# Patient Record
Sex: Male | Born: 1945 | Race: White | Hispanic: No | Marital: Married | Smoking: Former smoker
Health system: Southern US, Community
[De-identification: ages and names within clinical notes are randomized; demographics above are authoritative.]

## PROBLEM LIST (undated history)

## (undated) DIAGNOSIS — D649 Anemia, unspecified: Secondary | ICD-10-CM

## (undated) DIAGNOSIS — Z87891 Personal history of nicotine dependence: Secondary | ICD-10-CM

## (undated) DIAGNOSIS — M1990 Unspecified osteoarthritis, unspecified site: Secondary | ICD-10-CM

## (undated) DIAGNOSIS — N400 Enlarged prostate without lower urinary tract symptoms: Secondary | ICD-10-CM

## (undated) DIAGNOSIS — R0600 Dyspnea, unspecified: Secondary | ICD-10-CM

## (undated) DIAGNOSIS — D46Z Other myelodysplastic syndromes: Secondary | ICD-10-CM

## (undated) DIAGNOSIS — G4730 Sleep apnea, unspecified: Secondary | ICD-10-CM

## (undated) DIAGNOSIS — K219 Gastro-esophageal reflux disease without esophagitis: Secondary | ICD-10-CM

## (undated) HISTORY — DX: Gastro-esophageal reflux disease without esophagitis: K21.9

## (undated) HISTORY — DX: Anemia, unspecified: D64.9

## (undated) HISTORY — PX: APPENDECTOMY: SHX54

## (undated) HISTORY — DX: Unspecified osteoarthritis, unspecified site: M19.90

## (undated) HISTORY — PX: HERNIA REPAIR: SHX51

## (undated) HISTORY — PX: SHOULDER ARTHROSCOPY: SHX128

---

## 2004-11-14 ENCOUNTER — Encounter: Admission: RE | Admit: 2004-11-14 | Discharge: 2004-11-14 | Payer: Self-pay | Admitting: Family Medicine

## 2004-11-22 ENCOUNTER — Encounter: Admission: RE | Admit: 2004-11-22 | Discharge: 2004-11-22 | Payer: Self-pay | Admitting: Family Medicine

## 2007-09-14 ENCOUNTER — Ambulatory Visit: Payer: Self-pay | Admitting: Internal Medicine

## 2007-09-14 DIAGNOSIS — R0989 Other specified symptoms and signs involving the circulatory and respiratory systems: Secondary | ICD-10-CM

## 2007-09-14 DIAGNOSIS — R0609 Other forms of dyspnea: Secondary | ICD-10-CM | POA: Insufficient documentation

## 2007-09-14 DIAGNOSIS — G4730 Sleep apnea, unspecified: Secondary | ICD-10-CM | POA: Insufficient documentation

## 2007-09-15 ENCOUNTER — Ambulatory Visit: Payer: Self-pay | Admitting: Cardiology

## 2008-10-31 ENCOUNTER — Encounter: Admission: RE | Admit: 2008-10-31 | Discharge: 2008-11-29 | Payer: Self-pay | Admitting: Family Medicine

## 2009-03-07 ENCOUNTER — Ambulatory Visit: Payer: Self-pay | Admitting: Internal Medicine

## 2009-03-19 LAB — COMPREHENSIVE METABOLIC PANEL
ALT: 19 U/L (ref 0–53)
Albumin: 4.7 g/dL (ref 3.5–5.2)
CO2: 26 mEq/L (ref 19–32)
Chloride: 105 mEq/L (ref 96–112)
Glucose, Bld: 77 mg/dL (ref 70–99)
Potassium: 4.5 mEq/L (ref 3.5–5.3)
Sodium: 140 mEq/L (ref 135–145)
Total Bilirubin: 1 mg/dL (ref 0.3–1.2)
Total Protein: 6.8 g/dL (ref 6.0–8.3)

## 2009-03-19 LAB — CBC WITH DIFFERENTIAL/PLATELET
Basophils Absolute: 0.1 10*3/uL (ref 0.0–0.1)
Eosinophils Absolute: 0.2 10*3/uL (ref 0.0–0.5)
HGB: 12.2 g/dL — ABNORMAL LOW (ref 13.0–17.1)
MCV: 91.5 fL (ref 79.3–98.0)
MONO#: 0.5 10*3/uL (ref 0.1–0.9)
MONO%: 8.1 % (ref 0.0–14.0)
NEUT#: 3.2 10*3/uL (ref 1.5–6.5)
RBC: 3.9 10*6/uL — ABNORMAL LOW (ref 4.20–5.82)
RDW: 20.1 % — ABNORMAL HIGH (ref 11.0–14.6)
WBC: 5.6 10*3/uL (ref 4.0–10.3)
lymph#: 1.7 10*3/uL (ref 0.9–3.3)
nRBC: 2 % — ABNORMAL HIGH (ref 0–0)

## 2009-03-19 LAB — IRON AND TIBC
%SAT: 53 % (ref 20–55)
Iron: 161 ug/dL (ref 42–165)

## 2009-03-19 LAB — LACTATE DEHYDROGENASE: LDH: 174 U/L (ref 94–250)

## 2009-03-21 LAB — PROTEIN ELECTROPHORESIS, SERUM
Albumin ELP: 66.9 % — ABNORMAL HIGH (ref 55.8–66.1)
Alpha-1-Globulin: 3.5 % (ref 2.9–4.9)
Beta Globulin: 6 % (ref 4.7–7.2)
Total Protein, Serum Electrophoresis: 7.1 g/dL (ref 6.0–8.3)

## 2009-03-21 LAB — FOLATE: Folate: >20 ng/mL

## 2009-04-03 LAB — CBC WITH DIFFERENTIAL/PLATELET
Eosinophils Absolute: 0.3 10*3/uL (ref 0.0–0.5)
HCT: 34.6 % — ABNORMAL LOW (ref 38.4–49.9)
LYMPH%: 28.4 % (ref 14.0–49.0)
MCV: 91.3 fL (ref 79.3–98.0)
MONO#: 0.5 10*3/uL (ref 0.1–0.9)
NEUT#: 3.1 10*3/uL (ref 1.5–6.5)
NEUT%: 55.6 % (ref 39.0–75.0)
Platelets: 165 10*3/uL (ref 140–400)
WBC: 5.5 10*3/uL (ref 4.0–10.3)

## 2009-06-29 ENCOUNTER — Ambulatory Visit: Payer: Self-pay | Admitting: Internal Medicine

## 2009-07-02 LAB — CBC WITH DIFFERENTIAL/PLATELET
Basophils Absolute: 0 10*3/uL (ref 0.0–0.1)
EOS%: 5 % (ref 0.0–7.0)
HCT: 33.5 % — ABNORMAL LOW (ref 38.4–49.9)
HGB: 11.5 g/dL — ABNORMAL LOW (ref 13.0–17.1)
LYMPH%: 25.4 % (ref 14.0–49.0)
MCH: 31.5 pg (ref 27.2–33.4)
MONO#: 0.5 10*3/uL (ref 0.1–0.9)
NEUT%: 61.8 % (ref 39.0–75.0)
Platelets: 167 10*3/uL (ref 140–400)
lymph#: 1.5 10*3/uL (ref 0.9–3.3)

## 2009-09-18 ENCOUNTER — Encounter: Admission: RE | Admit: 2009-09-18 | Discharge: 2009-09-18 | Payer: Self-pay | Admitting: *Deleted

## 2009-09-27 ENCOUNTER — Ambulatory Visit: Payer: Self-pay | Admitting: Internal Medicine

## 2009-10-01 LAB — CBC WITH DIFFERENTIAL/PLATELET
Basophils Absolute: 0.1 10*3/uL (ref 0.0–0.1)
Eosinophils Absolute: 0.2 10*3/uL (ref 0.0–0.5)
HCT: 35.5 % — ABNORMAL LOW (ref 38.4–49.9)
HGB: 12.3 g/dL — ABNORMAL LOW (ref 13.0–17.1)
LYMPH%: 29.7 % (ref 14.0–49.0)
MCV: 92 fL (ref 79.3–98.0)
MONO#: 0.4 10*3/uL (ref 0.1–0.9)
MONO%: 7.2 % (ref 0.0–14.0)
NEUT#: 3.2 10*3/uL (ref 1.5–6.5)
NEUT%: 58 % (ref 39.0–75.0)
Platelets: ADEQUATE 10*3/uL (ref 140–400)
RBC: 3.86 10*6/uL — ABNORMAL LOW (ref 4.20–5.82)
WBC: 5.5 10*3/uL (ref 4.0–10.3)
nRBC: 1 % — ABNORMAL HIGH (ref 0–0)

## 2009-12-21 ENCOUNTER — Ambulatory Visit: Payer: Self-pay | Admitting: Internal Medicine

## 2009-12-21 LAB — CBC WITH DIFFERENTIAL/PLATELET
BASO%: 0.6 % (ref 0.0–2.0)
Eosinophils Absolute: 0.2 10*3/uL (ref 0.0–0.5)
HCT: 35.9 % — ABNORMAL LOW (ref 38.4–49.9)
MCHC: 34.4 g/dL (ref 32.0–36.0)
MONO#: 0.5 10*3/uL (ref 0.1–0.9)
NEUT#: 4.5 10*3/uL (ref 1.5–6.5)
RBC: 3.72 10*6/uL — ABNORMAL LOW (ref 4.20–5.82)
WBC: 6.7 10*3/uL (ref 4.0–10.3)
lymph#: 1.5 10*3/uL (ref 0.9–3.3)

## 2009-12-21 LAB — IRON AND TIBC
%SAT: 40 % (ref 20–55)
Iron: 109 ug/dL (ref 42–165)
TIBC: 272 ug/dL (ref 215–435)

## 2010-03-24 ENCOUNTER — Encounter: Payer: Self-pay | Admitting: Family Medicine

## 2010-03-25 ENCOUNTER — Encounter: Payer: Self-pay | Admitting: Family Medicine

## 2010-05-28 ENCOUNTER — Other Ambulatory Visit: Payer: Self-pay | Admitting: Family Medicine

## 2010-05-28 ENCOUNTER — Ambulatory Visit
Admission: RE | Admit: 2010-05-28 | Discharge: 2010-05-28 | Disposition: A | Discharge status: Home / Self Care | Payer: PRIVATE HEALTH INSURANCE | Source: Ambulatory Visit | Attending: Family Medicine | Admitting: Family Medicine

## 2010-05-28 DIAGNOSIS — R05 Cough: Secondary | ICD-10-CM

## 2010-05-28 DIAGNOSIS — R059 Cough, unspecified: Secondary | ICD-10-CM

## 2010-05-28 DIAGNOSIS — R509 Fever, unspecified: Secondary | ICD-10-CM

## 2010-09-02 ENCOUNTER — Other Ambulatory Visit: Payer: Self-pay | Admitting: Family Medicine

## 2010-09-09 ENCOUNTER — Other Ambulatory Visit: Payer: PRIVATE HEALTH INSURANCE

## 2010-09-09 ENCOUNTER — Ambulatory Visit
Admission: RE | Admit: 2010-09-09 | Discharge: 2010-09-09 | Disposition: A | Discharge status: Home / Self Care | Payer: PRIVATE HEALTH INSURANCE | Source: Ambulatory Visit | Attending: Family Medicine | Admitting: Family Medicine

## 2010-09-09 MED ORDER — IOHEXOL 300 MG/ML  SOLN
100.0000 mL | Freq: Once | INTRAMUSCULAR | Status: AC | PRN
  Administered 2010-09-09: 100 mL via INTRAVENOUS

## 2011-05-08 DIAGNOSIS — K219 Gastro-esophageal reflux disease without esophagitis: Secondary | ICD-10-CM | POA: Insufficient documentation

## 2011-09-19 DIAGNOSIS — G2581 Restless legs syndrome: Secondary | ICD-10-CM | POA: Insufficient documentation

## 2012-06-02 ENCOUNTER — Telehealth: Payer: Self-pay | Admitting: Internal Medicine

## 2012-06-02 NOTE — Telephone Encounter (Signed)
Mail out to [facility]

## 2012-06-29 ENCOUNTER — Other Ambulatory Visit: Payer: Self-pay | Admitting: Medical Oncology

## 2012-06-29 ENCOUNTER — Telehealth: Payer: Self-pay | Admitting: Medical Oncology

## 2012-06-29 NOTE — Telephone Encounter (Signed)
I left another message for Shawn Bentley to call about appt with Dr Arbutus Ped.

## 2012-06-30 ENCOUNTER — Telehealth: Payer: Self-pay | Admitting: Medical Oncology

## 2012-06-30 ENCOUNTER — Other Ambulatory Visit: Payer: Self-pay | Admitting: Medical Oncology

## 2012-06-30 DIAGNOSIS — D649 Anemia, unspecified: Secondary | ICD-10-CM

## 2012-06-30 NOTE — Telephone Encounter (Signed)
Dr Tenna Delaine office called and pt does need to be seen again .Onc Tx sent.

## 2012-07-22 ENCOUNTER — Telehealth: Payer: Self-pay | Admitting: Internal Medicine

## 2012-07-22 NOTE — Telephone Encounter (Signed)
S/w pt today re appt for 5/27 @ 11am. Pt last seen 2011. pof entered 4/30 for 3wk appt. pof did not cross over to scheduling inbox. Alerted by T[place] in HIM that pt was to have an appt this week.

## 2012-07-27 ENCOUNTER — Ambulatory Visit (HOSPITAL_BASED_OUTPATIENT_CLINIC_OR_DEPARTMENT_OTHER): Payer: PRIVATE HEALTH INSURANCE | Admitting: Internal Medicine

## 2012-07-27 ENCOUNTER — Encounter: Payer: Self-pay | Admitting: Internal Medicine

## 2012-07-27 ENCOUNTER — Telehealth: Payer: Self-pay | Admitting: Internal Medicine

## 2012-07-27 ENCOUNTER — Other Ambulatory Visit (HOSPITAL_BASED_OUTPATIENT_CLINIC_OR_DEPARTMENT_OTHER): Payer: PRIVATE HEALTH INSURANCE | Admitting: Lab

## 2012-07-27 VITALS — BP 116/65 | HR 63 | Temp 97.1°F | Resp 18 | Ht 67.5 in | Wt 183.5 lb

## 2012-07-27 DIAGNOSIS — D509 Iron deficiency anemia, unspecified: Secondary | ICD-10-CM

## 2012-07-27 DIAGNOSIS — D649 Anemia, unspecified: Secondary | ICD-10-CM

## 2012-07-27 LAB — COMPREHENSIVE METABOLIC PANEL (CC13)
ALT: 26 U/L (ref 0–55)
AST: 21 U/L (ref 5–34)
Albumin: 4 g/dL (ref 3.5–5.0)
CO2: 25 mEq/L (ref 22–29)
Calcium: 9.2 mg/dL (ref 8.4–10.4)
Chloride: 108 mEq/L — ABNORMAL HIGH (ref 98–107)
Potassium: 4.4 mEq/L (ref 3.5–5.1)

## 2012-07-27 LAB — CBC WITH DIFFERENTIAL/PLATELET
Basophils Absolute: 0.1 10*3/uL (ref 0.0–0.1)
Eosinophils Absolute: 0.2 10*3/uL (ref 0.0–0.5)
HGB: 10.8 g/dL — ABNORMAL LOW (ref 13.0–17.1)
LYMPH%: 28.6 % (ref 14.0–49.0)
MCV: 90.1 fL (ref 79.3–98.0)
MONO%: 8.9 % (ref 0.0–14.0)
NEUT#: 3.2 10*3/uL (ref 1.5–6.5)
NEUT%: 58.2 % (ref 39.0–75.0)
Platelets: 148 10*3/uL (ref 140–400)

## 2012-07-27 LAB — TECHNOLOGIST REVIEW

## 2012-07-27 LAB — IRON AND TIBC: %SAT: 93 % — ABNORMAL HIGH (ref 20–55)

## 2012-07-27 LAB — FERRITIN: Ferritin: 1074 ng/mL — ABNORMAL HIGH (ref 22–322)

## 2012-07-27 MED ORDER — INTEGRA PLUS PO CAPS: 1.0000 | ORAL_CAPSULE | Freq: Every morning | ORAL | Status: DC

## 2012-07-27 NOTE — Patient Instructions (Signed)
Resume Integra plus Followup visit in 2 months with repeat CBC and iron study.

## 2012-07-27 NOTE — Telephone Encounter (Signed)
gv pt appt schedule for July.  

## 2012-07-27 NOTE — Progress Notes (Signed)
[facility] Telephone:([phone]   Fax:(336) [phone]  OFFICE PROGRESS NOTE  [name], MD [phone] [address] [place] [address]  DIAGNOSIS: Iron deficiency anemia  PRIOR THERAPY: None.  CURRENT THERAPY: Integra plus 1 capsule by mouth daily.  INTERVAL HISTORY: Shawn Bentley 67 y.o. male returns to the clinic today for followup visit. The patient was last seen on 01/01/2010. He has been on treatment with Integra plus her iron deficiency anemia. Integra plus was discontinued and the patient used over the counter iron supplements for the last few years. He started feeling more tired and fatigued recently. He was seen by his primary care physician and was found to have anemia. He was referred back to me for evaluation. He denied having any other significant complaints. He has no chest pain, shortness of breath, cough or hemoptysis. He has no dizzy spells.   MEDICAL HISTORY: Past Medical History  Diagnosis Date  . Anemia   . Arthritis   . GERD (gastroesophageal reflux disease)     ALLERGIES:  is allergic to sinus.  MEDICATIONS:  Current Outpatient Prescriptions  Medication Sig Dispense Refill  . aspirin 81 MG tablet Take 81 mg by mouth daily.      . diphenhydrAMINE (BENADRYL) 25 mg capsule Take 25 mg by mouth 2 (two) times daily.      . ferrous sulfate 325 (65 FE) MG tablet Take 325 mg by mouth daily with breakfast.      . Multiple Vitamin (MULTIVITAMIN) tablet Take 1 tablet by mouth daily.      . saw palmetto 160 MG capsule Take 160 mg by mouth 2 (two) times daily.      . vitamin C (ASCORBIC ACID) 500 MG tablet Take 500 mg by mouth daily.      . vitamin E 400 UNIT capsule Take 400 Units by mouth daily.      Marland Kitchen omeprazole (PRILOSEC) 20 MG capsule Take 40 mg by mouth daily.      Marland Kitchen rOPINIRole (REQUIP) 0.5 MG tablet Take 0.5 mg by mouth as needed.       No current facility-administered medications for this visit.    SURGICAL HISTORY:    Past Surgical History  Procedure Laterality Date  . Appendectomy      REVIEW OF SYSTEMS:  A comprehensive review of systems was negative except for: Constitutional: positive for fatigue   PHYSICAL EXAMINATION: General appearance: alert, cooperative, fatigued and no distress Head: Normocephalic, without obvious abnormality, atraumatic Neck: no adenopathy Lymph nodes: Cervical, supraclavicular, and axillary nodes normal. Resp: clear to auscultation bilaterally Cardio: regular rate and rhythm, S1, S2 normal, no murmur, click, rub or gallop GI: soft, non-tender; bowel sounds normal; no masses,  no organomegaly Extremities: extremities normal, atraumatic, no cyanosis or edema  ECOG PERFORMANCE STATUS: 1 - Symptomatic but completely ambulatory  Blood pressure 116/65, pulse 63, temperature 97.1 F (36.2 C), temperature source Oral, resp. rate 18, height 5' 7.5" (1.715 m), weight 183 lb 8 oz (83.235 kg).  LABORATORY DATA: Lab Results  Component Value Date   WBC 5.5 07/27/2012   HGB 10.8* 07/27/2012   HCT 31.8* 07/27/2012   MCV 90.1 07/27/2012   PLT 148 07/27/2012      Chemistry      Component Value Date/Time   NA 141 07/27/2012 1035   NA 140 03/19/2009 0932   K 4.4 07/27/2012 1035   K 4.5 03/19/2009 0932   CL 108* 07/27/2012 1035  CL 105 03/19/2009 0932   CO2 25 07/27/2012 1035   CO2 26 03/19/2009 0932   BUN 15.7 07/27/2012 1035   BUN 17 03/19/2009 0932   CREATININE 0.9 07/27/2012 1035   CREATININE 1.28 03/19/2009 0932      Component Value Date/Time   CALCIUM 9.2 07/27/2012 1035   CALCIUM 9.5 03/19/2009 0932   ALKPHOS 62 07/27/2012 1035   ALKPHOS 57 03/19/2009 0932   AST 21 07/27/2012 1035   AST 16 03/19/2009 0932   ALT 26 07/27/2012 1035   ALT 19 03/19/2009 0932   BILITOT 1.32* 07/27/2012 1035   BILITOT 1.0 03/19/2009 0932       RADIOGRAPHIC STUDIES: No results found.  ASSESSMENT: This is a very pleasant 67 years old white male with history of iron deficiency anemia.   PLAN: I  ordered repeat CBC and iron study today. I will start the patient on Integra plus 1 capsule by mouth daily. I would see him back for followup visit in 2 months with repeat CBC, iron study and ferritin. All questions were answered. The patient knows to call the clinic with any problems, questions or concerns. We can certainly see the patient much sooner if necessary.

## 2012-09-07 ENCOUNTER — Telehealth: Payer: Self-pay | Admitting: Internal Medicine

## 2012-09-07 NOTE — Telephone Encounter (Signed)
s.w. pt and advised on 7.28.14 appt being move Dr. Kerry Fort on pal

## 2012-09-22 ENCOUNTER — Other Ambulatory Visit (HOSPITAL_BASED_OUTPATIENT_CLINIC_OR_DEPARTMENT_OTHER): Payer: Medicare Other

## 2012-09-22 DIAGNOSIS — D509 Iron deficiency anemia, unspecified: Secondary | ICD-10-CM

## 2012-09-22 LAB — CBC WITH DIFFERENTIAL/PLATELET
EOS%: 3.6 % (ref 0.0–7.0)
LYMPH%: 29 % (ref 14.0–49.0)
MCH: 31 pg (ref 27.2–33.4)
MCHC: 33.5 g/dL (ref 32.0–36.0)
MCV: 92.4 fL (ref 79.3–98.0)
MONO%: 10.5 % (ref 0.0–14.0)
RBC: 3.42 10*6/uL — ABNORMAL LOW (ref 4.20–5.82)
RDW: 25.3 % — ABNORMAL HIGH (ref 11.0–14.6)
nRBC: 2 % — ABNORMAL HIGH (ref 0–0)

## 2012-09-22 LAB — FERRITIN CHCC: Ferritin: 1131 ng/ml — ABNORMAL HIGH (ref 22–316)

## 2012-09-27 ENCOUNTER — Ambulatory Visit: Payer: PRIVATE HEALTH INSURANCE | Admitting: Internal Medicine

## 2012-09-28 ENCOUNTER — Ambulatory Visit (HOSPITAL_BASED_OUTPATIENT_CLINIC_OR_DEPARTMENT_OTHER): Payer: Medicare Other | Admitting: Internal Medicine

## 2012-09-28 ENCOUNTER — Telehealth: Payer: Self-pay | Admitting: Internal Medicine

## 2012-09-28 ENCOUNTER — Ambulatory Visit (HOSPITAL_BASED_OUTPATIENT_CLINIC_OR_DEPARTMENT_OTHER): Payer: Medicare Other | Admitting: Lab

## 2012-09-28 ENCOUNTER — Encounter: Payer: Self-pay | Admitting: Internal Medicine

## 2012-09-28 NOTE — Patient Instructions (Signed)
Discontinued treatment with Integra plus. Follow up visit in one month

## 2012-09-28 NOTE — Telephone Encounter (Signed)
pt returned to lab today and see MD end of August 2014 1 month from now

## 2012-09-28 NOTE — Progress Notes (Signed)
[facility] Telephone:(336) [phone]   Fax:([phone]  OFFICE PROGRESS NOTE  [name], MD [phone] [address], [address] [place] [address]  DIAGNOSIS: History of Iron deficiency anemia.   PRIOR THERAPY:  Integra plus 1 capsule by mouth daily..   CURRENT THERAPY: None.   INTERVAL HISTORY: Shawn Bentley 67 y.o. male returns to the clinic today for routine follow up visit. The patient is feeling fine today with no specific complaints except for mild fatigue. He is currently on treatment with Integra plus 1 capsule by mouth daily and tolerating it fairly well. He had repeat CBC, iron study and ferritin performed recently and he is here for evaluation and discussion of his lab results.his ferritin on 09/22/2012 was so elevated at 1131. Iron level was also elevated at 165 with iron saturation of 73%. His hemoglobin is still low at 10.6 with hematocrit of 31.6%.  MEDICAL HISTORY: Past Medical History  Diagnosis Date  . Anemia   . Arthritis   . GERD (gastroesophageal reflux disease)     ALLERGIES:  is allergic to sinus.  MEDICATIONS:  Current Outpatient Prescriptions  Medication Sig Dispense Refill  . aspirin 81 MG tablet Take 81 mg by mouth daily.      . diphenhydrAMINE (BENADRYL) 25 mg capsule Take 25 mg by mouth 2 (two) times daily.      Marland Kitchen FeFum-FePoly-FA-B Cmp-C-Biot (INTEGRA PLUS) CAPS Take 1 capsule by mouth every morning.  30 capsule  3  . Multiple Vitamin (MULTIVITAMIN) tablet Take 1 tablet by mouth daily.      Marland Kitchen omeprazole (PRILOSEC) 20 MG capsule Take 40 mg by mouth daily.      Marland Kitchen rOPINIRole (REQUIP) 0.5 MG tablet Take 0.5 mg by mouth as needed.      . saw palmetto 160 MG capsule Take 160 mg by mouth 2 (two) times daily.      . vitamin C (ASCORBIC ACID) 500 MG tablet Take 500 mg by mouth daily.      . vitamin E 400 UNIT capsule Take 400 Units by mouth daily.       No current facility-administered medications for this visit.    SURGICAL  HISTORY:  Past Surgical History  Procedure Laterality Date  . Appendectomy      REVIEW OF SYSTEMS:  A comprehensive review of systems was negative except for: Constitutional: positive for fatigue   PHYSICAL EXAMINATION: General appearance: alert, cooperative and fatigued Head: Normocephalic, without obvious abnormality, atraumatic Neck: no adenopathy Lymph nodes: Cervical, supraclavicular, and axillary nodes normal. Resp: clear to auscultation bilaterally Cardio: regular rate and rhythm, S1, S2 normal, no murmur, click, rub or gallop GI: soft, non-tender; bowel sounds normal; no masses,  no organomegaly Extremities: extremities normal, atraumatic, no cyanosis or edema  ECOG PERFORMANCE STATUS: 0 - Asymptomatic  Blood pressure 123/72, pulse 64, temperature 97.4 F (36.3 C), temperature source Oral, resp. rate 20, height 5' 7.5" (1.715 m), weight 187 lb 1.6 oz (84.868 kg).  LABORATORY DATA: Lab Results  Component Value Date   WBC 5.3 09/22/2012   HGB 10.6* 09/22/2012   HCT 31.6* 09/22/2012   MCV 92.4 09/22/2012   PLT 144 09/22/2012      Chemistry      Component Value Date/Time   NA 141 07/27/2012 1035   NA 140 03/19/2009 0932   K 4.4 07/27/2012 1035   K 4.5 03/19/2009 0932   CL 108* 07/27/2012 1035   CL 105 03/19/2009 0932  CO2 25 07/27/2012 1035   CO2 26 03/19/2009 0932   BUN 15.7 07/27/2012 1035   BUN 17 03/19/2009 0932   CREATININE 0.9 07/27/2012 1035   CREATININE 1.28 03/19/2009 0932      Component Value Date/Time   CALCIUM 9.2 07/27/2012 1035   CALCIUM 9.5 03/19/2009 0932   ALKPHOS 62 07/27/2012 1035   ALKPHOS 57 03/19/2009 0932   AST 21 07/27/2012 1035   AST 16 03/19/2009 0932   ALT 26 07/27/2012 1035   ALT 19 03/19/2009 0932   BILITOT 1.32* 07/27/2012 1035   BILITOT 1.0 03/19/2009 0932       RADIOGRAPHIC STUDIES: No results found.  ASSESSMENT AND PLAN: this is a very pleasant 67 years old white male with a persistent anemia thought initially to be secondary to iron  deficiency and the patient was treated with Integra plus. His grandmother are still elevated at this point. I recommended for the patient to discontinue his treatment with Integra plus. I will check the patient for hemochromatosis DNA to rule out any iron metabolism abnormality. He would come back for follow up visit in one month's for reevaluation. He was advised to call immediately if he has any concerning symptoms in the interval.  The patient voices understanding of current disease status and treatment options and is in agreement with the current care plan.  All questions were answered. The patient knows to call the clinic with any problems, questions or concerns. We can certainly see the patient much sooner if necessary.

## 2012-09-30 LAB — HEAVY METALS, BLOOD
Arsenic: <3 mcg/L (ref <23)
Lead: <2 ug/dL (ref <10)
Mercury, B: <4 mcg/L (ref <=10)

## 2012-09-30 LAB — HEMOCHROMATOSIS DNA-PCR(C282Y,H63D)

## 2012-10-27 ENCOUNTER — Other Ambulatory Visit: Payer: Self-pay | Admitting: *Deleted

## 2012-10-28 ENCOUNTER — Ambulatory Visit: Payer: Medicare Other | Admitting: Internal Medicine

## 2012-10-28 ENCOUNTER — Telehealth: Payer: Self-pay | Admitting: Internal Medicine

## 2012-10-28 NOTE — Telephone Encounter (Signed)
sw. pt and advised on 8.3.14 est visit....pt ok and aware

## 2012-11-03 ENCOUNTER — Encounter: Payer: Self-pay | Admitting: Physician Assistant

## 2012-11-03 ENCOUNTER — Ambulatory Visit (HOSPITAL_BASED_OUTPATIENT_CLINIC_OR_DEPARTMENT_OTHER): Payer: Medicare Other | Admitting: Lab

## 2012-11-03 ENCOUNTER — Telehealth: Payer: Self-pay | Admitting: Internal Medicine

## 2012-11-03 ENCOUNTER — Ambulatory Visit (HOSPITAL_BASED_OUTPATIENT_CLINIC_OR_DEPARTMENT_OTHER): Payer: Medicare Other | Admitting: Physician Assistant

## 2012-11-03 VITALS — BP 119/77 | HR 63 | Temp 97.7°F | Resp 18 | Ht 67.0 in | Wt 183.7 lb

## 2012-11-03 DIAGNOSIS — D649 Anemia, unspecified: Secondary | ICD-10-CM | POA: Insufficient documentation

## 2012-11-03 DIAGNOSIS — R300 Dysuria: Secondary | ICD-10-CM

## 2012-11-03 LAB — CBC WITH DIFFERENTIAL/PLATELET
BASO%: 0.9 % (ref 0.0–2.0)
Basophils Absolute: 0.1 10*3/uL (ref 0.0–0.1)
EOS%: 4.2 % (ref 0.0–7.0)
HCT: 32.5 % — ABNORMAL LOW (ref 38.4–49.9)
HGB: 11 g/dL — ABNORMAL LOW (ref 13.0–17.1)
MCH: 31.1 pg (ref 27.2–33.4)
MCHC: 33.8 g/dL (ref 32.0–36.0)
MCV: 91.8 fL (ref 79.3–98.0)
MONO%: 11.6 % (ref 0.0–14.0)
NEUT%: 58.8 % (ref 39.0–75.0)

## 2012-11-03 LAB — URINALYSIS, MICROSCOPIC - CHCC
Leukocyte Esterase: NEGATIVE
Protein: <30 mg/dL
Urobilinogen, UR: 0.2 mg/dL (ref 0.2–1)
pH: 6.5 (ref 4.6–8.0)

## 2012-11-03 NOTE — Patient Instructions (Addendum)
You are being set up for a bone marrow biopsy to further evaluate your anemia You will followup with Dr. Arbutus Ped in approximately 3 weeks to discuss the results of this study

## 2012-11-03 NOTE — Progress Notes (Addendum)
[facility] Telephone:(336) [phone]   [phone]  SHARED VISIT PROGRESS NOTE  Shawn Copas, MD [phone] N. [address], [address] [place] [address]  DIAGNOSIS: History of Iron deficiency anemia.   PRIOR THERAPY:  Integra plus 1 capsule by mouth daily..   CURRENT THERAPY: None.   INTERVAL HISTORY: Shawn Bentley 67 y.o. male returns to the clinic today for routine follow up visit, accompanied by his wife. He complains of increased fatigue. He has been coming home from work at lunch and sleeping for about 4 hours.  He complains of occasional burning with urination over the past couple months. He has stopped that Dr. Asa Lente instructions his iron supplement as well as all of his vitamin supplements. He had further laboratory studies done after his last office visit in late July was and is here to discuss those test results.  He had blood drawn for hemochromatosis  and he only had 1 gene for a heterozygous hemochromatosis present. His  studies for heavy metals were negative. He had an extensive workup in January 2011 which revealed a normal erythropoietin level, normal B12 level, and normal folate level. A serum protein electrophoresis  revealed no M spike the albumin ELP was 66.9 and considered high and the alpha 2 globulin was 6.5 been considered low. His ferritin on 09/22/2012 was very elevated at 1131. Iron level was also elevated at 165 with iron saturation of 73%. His hemoglobin is still low at 10.6 with hematocrit of 31.6%. Today his hemoglobin was 11.0 with hematocrit of 32.5%.  MEDICAL HISTORY: Past Medical History  Diagnosis Date  . Anemia   . Arthritis   . GERD (gastroesophageal reflux disease)     ALLERGIES:  is allergic to sinus.  MEDICATIONS:  Current Outpatient Prescriptions  Medication Sig Dispense Refill  . aspirin 81 MG tablet Take 81 mg by mouth daily.      . diphenhydrAMINE (BENADRYL) 25 mg capsule Take 25 mg by mouth 2 (two) times  daily.      Marland Kitchen FeFum-FePoly-FA-B Cmp-C-Biot (INTEGRA PLUS) CAPS Take 1 capsule by mouth every morning.  30 capsule  3  . Multiple Vitamin (MULTIVITAMIN) tablet Take 1 tablet by mouth daily.      Marland Kitchen omeprazole (PRILOSEC) 20 MG capsule Take 40 mg by mouth daily.      Marland Kitchen rOPINIRole (REQUIP) 0.5 MG tablet Take 0.5 mg by mouth as needed.      . saw palmetto 160 MG capsule Take 160 mg by mouth 2 (two) times daily.      . vitamin C (ASCORBIC ACID) 500 MG tablet Take 500 mg by mouth daily.      . vitamin E 400 UNIT capsule Take 400 Units by mouth daily.       No current facility-administered medications for this visit.    SURGICAL HISTORY:  Past Surgical History  Procedure Laterality Date  . Appendectomy      REVIEW OF SYSTEMS:  A comprehensive review of systems was negative except for: Constitutional: positive for fatigue Genitourinary: positive for dysuria   PHYSICAL EXAMINATION: General appearance: alert, cooperative and fatigued Head: Normocephalic, without obvious abnormality, atraumatic Neck: no adenopathy Lymph nodes: Cervical, supraclavicular, and axillary nodes normal. Resp: clear to auscultation bilaterally Cardio: regular rate and rhythm, S1, S2 normal, no murmur, click, rub or gallop GI: soft, non-tender; bowel sounds normal; no masses,  no organomegaly Extremities: extremities normal, atraumatic, no cyanosis or edema  ECOG PERFORMANCE STATUS: 0 - Asymptomatic  Blood pressure 119/77, pulse 63, temperature 97.7 F (36.5 C), temperature source Oral, resp. rate 18, height 5\' 7"  (1.702 m), weight 183 lb 11.2 oz (83.326 kg).  LABORATORY DATA: Lab Results  Component Value Date   WBC 6.4 11/03/2012   HGB 11.0* 11/03/2012   HCT 32.5* 11/03/2012   MCV 91.8 11/03/2012   PLT 195 11/03/2012      Chemistry      Component Value Date/Time   NA 141 07/27/2012 1035   NA 140 03/19/2009 0932   K 4.4 07/27/2012 1035   K 4.5 03/19/2009 0932   CL 108* 07/27/2012 1035   CL 105 03/19/2009 0932   CO2  25 07/27/2012 1035   CO2 26 03/19/2009 0932   BUN 15.7 07/27/2012 1035   BUN 17 03/19/2009 0932   CREATININE 0.9 07/27/2012 1035   CREATININE 1.28 03/19/2009 0932      Component Value Date/Time   CALCIUM 9.2 07/27/2012 1035   CALCIUM 9.5 03/19/2009 0932   ALKPHOS 62 07/27/2012 1035   ALKPHOS 57 03/19/2009 0932   AST 21 07/27/2012 1035   AST 16 03/19/2009 0932   ALT 26 07/27/2012 1035   ALT 19 03/19/2009 0932   BILITOT 1.32* 07/27/2012 1035   BILITOT 1.0 03/19/2009 0932       RADIOGRAPHIC STUDIES: No results found.  ASSESSMENT AND PLAN: this is a very pleasant 67 years old white male with a persistent anemia thought initially to be secondary to iron deficiency and the patient was treated with Integra plus. Patient was discussed with also seen by Dr. Arbutus Ped. He has anemia without a clear etiology particularly in the setting of slightly low hemoglobin and hematocrit with high ferritin and elevated serum iron and iron saturation. We will plan to refer the patient to interventional radiology for bone marrow biopsy and aspirate sent off for morphology cytology and cytogenetics. This will be done in the next week with the patient returning approximately 2 weeks after that to discuss the results of that study. He will remain off of all iron supplementation in the interim. To further assess the patient's complaints of intermittent dysuria we'll obtain a clean catch urinalysis today. If indicated culture and sensitivity will be sent and an infection is present appropriate antibiotic therapy will be instituted.   Laural Benes, Annalyce Lanpher E, PA-C   He was advised to call immediately if he has any concerning symptoms in the interval.  The patient voices understanding of current disease status and treatment options and is in agreement with the current care plan.  All questions were answered. The patient knows to call the clinic with any problems, questions or concerns. We can certainly see the patient much sooner if  necessary.   ADDENDUM:  Hematology/Oncology Attending:  I have face to face encounter with the patient. I recommended his care plan. The patient has persistent anemia in the absence of iron deficiency or any other abnormalities. 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 The heavy metal studies were unremarkable and transferrin receptor was elevated at 2.66. I recommended for the patient to proceed with the bone marrow biopsy and aspirate for evaluation of his persistent anemia. I would see him back for followup visit in 2 weeks for evaluation and discussion of his biopsy results. He was advised to call immediately if he has any concerning symptoms in the interval. Lajuana Matte., MD 11/06/2012

## 2012-11-03 NOTE — Telephone Encounter (Signed)
gv andprinted appt sched and avs for pt for SEpt...sent pt back to the lab °

## 2012-11-04 ENCOUNTER — Other Ambulatory Visit: Payer: Self-pay | Admitting: Radiology

## 2012-11-08 ENCOUNTER — Ambulatory Visit (HOSPITAL_COMMUNITY)
Admission: RE | Admit: 2012-11-08 | Discharge: 2012-11-08 | Disposition: A | Discharge status: Home / Self Care | Payer: Medicare Other | Source: Ambulatory Visit | Attending: Internal Medicine | Admitting: Internal Medicine

## 2012-11-08 ENCOUNTER — Encounter (HOSPITAL_COMMUNITY): Payer: Self-pay

## 2012-11-08 DIAGNOSIS — D649 Anemia, unspecified: Secondary | ICD-10-CM | POA: Insufficient documentation

## 2012-11-08 DIAGNOSIS — D696 Thrombocytopenia, unspecified: Secondary | ICD-10-CM | POA: Insufficient documentation

## 2012-11-08 LAB — CBC
HCT: 31.9 % — ABNORMAL LOW (ref 39.0–52.0)
Hemoglobin: 11.2 g/dL — ABNORMAL LOW (ref 13.0–17.0)
RBC: 3.5 MIL/uL — ABNORMAL LOW (ref 4.22–5.81)
WBC: 4.7 10*3/uL (ref 4.0–10.5)

## 2012-11-08 LAB — BONE MARROW EXAM

## 2012-11-08 LAB — APTT: aPTT: 29 seconds (ref 24–37)

## 2012-11-08 LAB — PROTIME-INR: INR: 1.09 (ref 0.00–1.49)

## 2012-11-08 MED ORDER — MIDAZOLAM HCL 2 MG/2ML IJ SOLN
INTRAMUSCULAR | Status: AC
  Filled 2012-11-08: qty 6

## 2012-11-08 MED ORDER — FENTANYL CITRATE 0.05 MG/ML IJ SOLN
INTRAMUSCULAR | Status: DC | PRN
  Administered 2012-11-08: 100 ug via INTRAVENOUS

## 2012-11-08 MED ORDER — MIDAZOLAM HCL 2 MG/2ML IJ SOLN
INTRAMUSCULAR | Status: DC | PRN
  Administered 2012-11-08 (×2): 1 mg via INTRAVENOUS

## 2012-11-08 MED ORDER — FENTANYL CITRATE 0.05 MG/ML IJ SOLN
INTRAMUSCULAR | Status: AC
  Filled 2012-11-08: qty 6

## 2012-11-08 MED ORDER — SODIUM CHLORIDE 0.9 % IV SOLN
INTRAVENOUS | Status: DC
  Administered 2012-11-08: 09:00:00 via INTRAVENOUS

## 2012-11-08 NOTE — H&P (Signed)
Agree 

## 2012-11-08 NOTE — Procedures (Signed)
Procedure:  CT guided bone marrow biopsy Findings:  Aspirate and core biopsy of right iliac bone marrow. No complications. 

## 2012-11-08 NOTE — H&P (Signed)
Shawn Bentley is an 67 y.o. male.   Chief Complaint: "I'm having a bone biopsy" HPI: Patient with history of anemia of unknown etiology presents today for CT guided bone marrow biopsy.  Past Medical History  Diagnosis Date  . Anemia   . Arthritis   . GERD (gastroesophageal reflux disease)     Past Surgical History  Procedure Laterality Date  . Appendectomy      History reviewed. No pertinent family history. Social History:  reports that he quit smoking about 40 years ago. His smoking use included Cigarettes. He has a 6 pack-year smoking history. He quit smokeless tobacco use about 40 years ago. He reports that  drinks alcohol. His drug history is not on file.  Allergies:  Allergies  Allergen Reactions  . Sinus [Pseudoephedrine-Acetaminophen] Other (See Comments)    jittery    Current outpatient prescriptions:diphenhydrAMINE (BENADRYL) 25 mg capsule, Take 25 mg by mouth 2 (two) times daily., Disp: , Rfl: ;  FeFum-FePoly-FA-B Cmp-C-Biot (INTEGRA PLUS) CAPS, Take 1 capsule by mouth every morning., Disp: 30 capsule, Rfl: 3;  omeprazole (PRILOSEC) 20 MG capsule, Take 20 mg by mouth daily. , Disp: , Rfl: ;  rOPINIRole (REQUIP) 0.5 MG tablet, Take 0.5 mg by mouth at bedtime as needed (for restless leg syndrome). , Disp: , Rfl:  saw palmetto 160 MG capsule, Take 160 mg by mouth 2 (two) times daily., Disp: , Rfl:  Current facility-administered medications:0.9 %  sodium chloride infusion, , Intravenous, Continuous, D Jeananne Rama, PA-C, Last Rate: 20 mL/hr at 11/08/12 6440   Results for orders placed during the hospital encounter of 11/08/12 (from the past 48 hour(s))  APTT     Status: None   Collection Time    11/08/12  9:20 AM      Result Value Range   aPTT 29  24 - 37 seconds  CBC     Status: Abnormal   Collection Time    11/08/12  9:20 AM      Result Value Range   WBC 4.7  4.0 - 10.5 K/uL   RBC 3.50 (*) 4.22 - 5.81 MIL/uL   Hemoglobin 11.2 (*) 13.0 - 17.0 g/dL   HCT 34.7 (*)  42.5 - 52.0 %   MCV 91.1  78.0 - 100.0 fL   MCH 32.0  26.0 - 34.0 pg   MCHC 35.1  30.0 - 36.0 g/dL   RDW 95.6 (*) 38.7 - 56.4 %   Platelets 133 (*) 150 - 400 K/uL   Comment: SPECIMEN CHECKED FOR CLOTS     REPEATED TO VERIFY  PROTIME-INR     Status: None   Collection Time    11/08/12  9:20 AM      Result Value Range   Prothrombin Time 13.9  11.6 - 15.2 seconds   INR 1.09  0.00 - 1.49   No results found.  Review of Systems  Constitutional: Negative for fever and chills.  Respiratory: Positive for shortness of breath. Negative for hemoptysis.        Occ cough  Cardiovascular: Negative for chest pain.  Gastrointestinal: Negative for nausea, vomiting, abdominal pain and blood in stool.  Genitourinary: Negative for hematuria.       Occ dysuria  Musculoskeletal: Positive for back pain.  Neurological: Negative for headaches.  Endo/Heme/Allergies: Does not bruise/bleed easily.    Blood pressure 111/57, pulse 53, temperature 97.8 F (36.6 C), temperature source Oral, resp. rate 18, height 5\' 7"  (1.702 m), weight 180 lb (81.647 kg), SpO2  99.00%. Physical Exam  Constitutional: He is oriented to person, place, and time. He appears well-developed and well-nourished.  Cardiovascular: Regular rhythm.   Sl brady but reg rhythm  GI: Soft. Bowel sounds are normal. There is no tenderness.  Musculoskeletal: Normal range of motion. He exhibits no edema.  Neurological: He is alert and oriented to person, place, and time.     Assessment/Plan Pt with hx of anemia of unknown etiology . Plan is for CT guided bone marrow biopsy today. Details/risks of procedure d/w pt/wife with their understanding and consent.  ALLRED,D KEVIN 11/08/2012, 9:55 AM

## 2012-11-16 LAB — CHROMOSOME ANALYSIS, BONE MARROW

## 2012-11-19 ENCOUNTER — Telehealth: Payer: Self-pay | Admitting: Internal Medicine

## 2012-11-19 NOTE — Telephone Encounter (Signed)
talked to pt and gave him appt for 9/30 r/s from 9/22 per MD's request

## 2012-11-22 ENCOUNTER — Ambulatory Visit: Payer: Medicare Other | Admitting: Internal Medicine

## 2012-11-30 ENCOUNTER — Telehealth: Payer: Self-pay | Admitting: Internal Medicine

## 2012-11-30 ENCOUNTER — Ambulatory Visit (HOSPITAL_BASED_OUTPATIENT_CLINIC_OR_DEPARTMENT_OTHER): Payer: Medicare Other | Admitting: Internal Medicine

## 2012-11-30 ENCOUNTER — Encounter: Payer: Self-pay | Admitting: Internal Medicine

## 2012-11-30 VITALS — BP 133/72 | HR 66 | Temp 97.4°F | Resp 18 | Ht 67.0 in | Wt 184.2 lb

## 2012-11-30 DIAGNOSIS — D4620 Refractory anemia with excess of blasts, unspecified: Secondary | ICD-10-CM

## 2012-11-30 DIAGNOSIS — D649 Anemia, unspecified: Secondary | ICD-10-CM

## 2012-11-30 NOTE — Telephone Encounter (Signed)
Gave pt appt for March 2015 lab and MD °

## 2012-11-30 NOTE — Patient Instructions (Signed)
Followup visit in 6 months with repeat CBC and comprehensive metabolic panel.

## 2012-11-30 NOTE — Progress Notes (Signed)
[facility] [phone]   Fax:(336) [phone]  OFFICE PROGRESS NOTE  [name], MD [phone] 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  PRIOR THERAPY: None  CURRENT THERAPY: Observation  INTERVAL HISTORY: Shawn Bentley 67 y.o. male returns to the clinic today for followup visit accompanied by his wife. The patient is feeling fine today with no specific complaints except for mild fatigue. He underwent a bone marrow biopsy and aspirate on 11/08/2012 and he is here for evaluation and discussion of his biopsy and lab results. The patient denied having any significant chest pain, shortness of breath, cough or hemoptysis. He denied having any bleeding issues. He has no weight loss or night sweats.  MEDICAL HISTORY: Past Medical History  Diagnosis Date  . Anemia   . Arthritis   . GERD (gastroesophageal reflux disease)     ALLERGIES:  is allergic to sinus.  MEDICATIONS:  Current Outpatient Prescriptions  Medication Sig Dispense Refill  . diphenhydrAMINE (BENADRYL) 25 mg capsule Take 25 mg by mouth 2 (two) times daily.      Marland Kitchen omeprazole (PRILOSEC) 20 MG capsule Take 20 mg by mouth daily.       Marland Kitchen rOPINIRole (REQUIP) 0.5 MG tablet Take 0.5 mg by mouth at bedtime as needed (for restless leg syndrome).       . saw palmetto 160 MG capsule Take 160 mg by mouth 2 (two) times daily.       No current facility-administered medications for this visit.    SURGICAL HISTORY:  Past Surgical History  Procedure Laterality Date  . Appendectomy      REVIEW OF SYSTEMS:  A comprehensive review of systems was negative except for: Constitutional: positive for fatigue   PHYSICAL EXAMINATION: General appearance: alert, cooperative and no distress Head: Normocephalic, without obvious abnormality, atraumatic Neck: no  adenopathy, no JVD, supple, symmetrical, trachea midline and thyroid not enlarged, symmetric, no tenderness/mass/nodules Lymph nodes: Cervical, supraclavicular, and axillary nodes normal. Resp: clear to auscultation bilaterally Cardio: regular rate and rhythm, S1, S2 normal, no murmur, click, rub or gallop GI: soft, non-tender; bowel sounds normal; no masses,  no organomegaly Extremities: extremities normal, atraumatic, no cyanosis or edema  ECOG PERFORMANCE STATUS: 1 - Symptomatic but completely ambulatory  Blood pressure 133/72, pulse 66, temperature 97.4 F (36.3 C), temperature source Oral, resp. rate 18, height 5\' 7"  (1.702 m), weight 184 lb 3.2 oz (83.553 kg).  LABORATORY DATA: Lab Results  Component Value Date   WBC 4.7 11/08/2012   HGB 11.2* 11/08/2012   HCT 31.9* 11/08/2012   MCV 91.1 11/08/2012   PLT 133* 11/08/2012      Chemistry      Component Value Date/Time   NA 141 07/27/2012 1035   NA 140 03/19/2009 0932   K 4.4 07/27/2012 1035   K 4.5 03/19/2009 0932   CL 108* 07/27/2012 1035   CL 105 03/19/2009 0932   CO2 25 07/27/2012 1035   CO2 26 03/19/2009 0932   BUN 15.7 07/27/2012 1035   BUN 17 03/19/2009 0932   CREATININE 0.9 07/27/2012 1035   CREATININE 1.28 03/19/2009 0932      Component Value Date/Time   CALCIUM 9.2 07/27/2012 1035   CALCIUM 9.5 03/19/2009 0932   ALKPHOS 62 07/27/2012 1035   ALKPHOS 57 03/19/2009 0932   AST 21 07/27/2012 1035   AST 16 03/19/2009  0932   ALT 26 07/27/2012 1035   ALT 19 03/19/2009 0932   BILITOT 1.32* 07/27/2012 1035   BILITOT 1.0 03/19/2009 0932       RADIOGRAPHIC STUDIES: Ct Biopsy  11/08/2012   *RADIOLOGY REPORT*  Clinical Data: Anemia and need for bone marrow biopsy.  CT GUIDED ASPIRATE AND CORE BIOPSY OF RIGHT ILIAC BONE MARROW  Sedation: Versed 2.0 mg IV, Fentanyl 100 mcg IV  Total Moderate Sedation Time: 10 minutes.  Procedure:  The procedure risks, benefits, and alternatives were explained to the patient.  Questions regarding the procedure were  encouraged and answered.  The patient understands and consents to the procedure.  The right gluteal region was prepped with Betadine.  Sterile gown and sterile gloves were used for the procedure.  Local anesthesia was provided with 1% Lidocaine.  Under CT guidance, an 11 gauge OnControl bone cutting needle was advanced from a posterior approach into the right iliac bone. Needle positioning was confirmed with CT.  Initial non heparinized and heparinized aspirate samples were obtained of bone marrow.  Core biopsy was performed with the outer needle.  Two separate core biopsy samples were obtained.  Complications: None  Findings:  Inspection of initial aspirate did reveal visible particles.  Intact core biopsy samples were able to be obtained.  IMPRESSION: CT guided bone marrow biopsy of right posterior iliac bone with both aspirate and core samples obtained.   Original Report Authenticated By: Irish Lack, M.D.    ASSESSMENT AND PLAN: This is a very pleasant 67 years old white male recently diagnosed with questionable myelodysplastic syndrome, refractory anemia with ring sideroblasts. The patient is currently doing fine and asymptomatic except for the mild anemia. I recommended for him to continue on observation for now. I would see him back for followup visit in 6 months with repeat CBC and comprehensive metabolic panel. He was advised to call immediately if he has any concerning symptoms in the interval.  The patient voices understanding of current disease status and treatment options and is in agreement with the current care plan.  All questions were answered. The patient knows to call the clinic with any problems, questions or concerns. We can certainly see the patient much sooner if necessary.

## 2013-03-01 ENCOUNTER — Encounter (HOSPITAL_COMMUNITY): Payer: Self-pay

## 2013-05-30 ENCOUNTER — Ambulatory Visit (HOSPITAL_BASED_OUTPATIENT_CLINIC_OR_DEPARTMENT_OTHER): Payer: Medicare Other | Admitting: Internal Medicine

## 2013-05-30 ENCOUNTER — Encounter: Payer: Self-pay | Admitting: Internal Medicine

## 2013-05-30 ENCOUNTER — Telehealth: Payer: Self-pay | Admitting: Internal Medicine

## 2013-05-30 ENCOUNTER — Other Ambulatory Visit (HOSPITAL_BASED_OUTPATIENT_CLINIC_OR_DEPARTMENT_OTHER): Payer: Medicare Other

## 2013-05-30 VITALS — BP 127/66 | HR 56 | Temp 97.4°F | Resp 18 | Ht 67.0 in | Wt 175.7 lb

## 2013-05-30 DIAGNOSIS — D4620 Refractory anemia with excess of blasts, unspecified: Secondary | ICD-10-CM

## 2013-05-30 DIAGNOSIS — D649 Anemia, unspecified: Secondary | ICD-10-CM

## 2013-05-30 LAB — CBC WITH DIFFERENTIAL/PLATELET
BASO%: 2.3 % — AB (ref 0.0–2.0)
BASOS ABS: 0.1 10*3/uL (ref 0.0–0.1)
EOS%: 3.4 % (ref 0.0–7.0)
Eosinophils Absolute: 0.2 10*3/uL (ref 0.0–0.5)
HEMATOCRIT: 36.7 % — AB (ref 38.4–49.9)
HEMOGLOBIN: 12 g/dL — AB (ref 13.0–17.1)
LYMPH%: 27.8 % (ref 14.0–49.0)
MCH: 31 pg (ref 27.2–33.4)
MCHC: 32.8 g/dL (ref 32.0–36.0)
MCV: 94.6 fL (ref 79.3–98.0)
MONO#: 0.6 10*3/uL (ref 0.1–0.9)
MONO%: 8.9 % (ref 0.0–14.0)
NEUT#: 3.7 10*3/uL (ref 1.5–6.5)
NEUT%: 57.6 % (ref 39.0–75.0)
PLATELETS: 165 10*3/uL (ref 140–400)
RBC: 3.88 10*6/uL — ABNORMAL LOW (ref 4.20–5.82)
RDW: 28.9 % — ABNORMAL HIGH (ref 11.0–14.6)
WBC: 6.5 10*3/uL (ref 4.0–10.3)
lymph#: 1.8 10*3/uL (ref 0.9–3.3)

## 2013-05-30 LAB — COMPREHENSIVE METABOLIC PANEL (CC13)
ALBUMIN: 4.4 g/dL (ref 3.5–5.0)
ALT: 28 U/L (ref 0–55)
AST: 20 U/L (ref 5–34)
Alkaline Phosphatase: 64 U/L (ref 40–150)
Anion Gap: 9 mEq/L (ref 3–11)
BUN: 17.1 mg/dL (ref 7.0–26.0)
CALCIUM: 9.3 mg/dL (ref 8.4–10.4)
CHLORIDE: 108 meq/L (ref 98–109)
CO2: 23 mEq/L (ref 22–29)
CREATININE: 1 mg/dL (ref 0.7–1.3)
GLUCOSE: 96 mg/dL (ref 70–140)
POTASSIUM: 4.5 meq/L (ref 3.5–5.1)
Sodium: 139 mEq/L (ref 136–145)
Total Bilirubin: 1.62 mg/dL — ABNORMAL HIGH (ref 0.20–1.20)
Total Protein: 6.9 g/dL (ref 6.4–8.3)

## 2013-05-30 LAB — LACTATE DEHYDROGENASE (CC13): LDH: 250 U/L — ABNORMAL HIGH (ref 125–245)

## 2013-05-30 LAB — TECHNOLOGIST REVIEW

## 2013-05-30 NOTE — Telephone Encounter (Signed)
gv pt appt schedule for sept °

## 2013-05-30 NOTE — Progress Notes (Signed)
[place] [phone]   Fax:([phone]  OFFICE PROGRESS NOTE  Cammy Copa, MD [phone] [address], [address] 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  PRIOR THERAPY: None.  CURRENT THERAPY: Observation.  INTERVAL HISTORY: Shawn Bentley 68 y.o. male returns to the clinic today for followup visit. The patient is feeling fine today with no specific complaints.  He denied having any significant fatigue weakness. The patient denied having any significant chest pain, shortness of breath, cough or hemoptysis. He denied having any bleeding issues. He has no weight loss or night sweats. He has repeat CBC performed earlier today and is here for evaluation and discussion of his lab results.  MEDICAL HISTORY: Past Medical History  Diagnosis Date  . Anemia   . Arthritis   . GERD (gastroesophageal reflux disease)     ALLERGIES:  is allergic to sinus.  MEDICATIONS:  Current Outpatient Prescriptions  Medication Sig Dispense Refill  . diphenhydrAMINE (BENADRYL) 25 mg capsule Take 25 mg by mouth 2 (two) times daily.      Marland Kitchen omeprazole (PRILOSEC) 20 MG capsule Take 20 mg by mouth daily.       Marland Kitchen rOPINIRole (REQUIP) 0.5 MG tablet Take 0.5 mg by mouth at bedtime as needed (for restless leg syndrome).        No current facility-administered medications for this visit.    SURGICAL HISTORY:  Past Surgical History  Procedure Laterality Date  . Appendectomy      REVIEW OF SYSTEMS:  A comprehensive review of systems was negative.   PHYSICAL EXAMINATION: General appearance: alert, cooperative and no distress Head: Normocephalic, without obvious abnormality, atraumatic Neck: no adenopathy, no JVD, supple, symmetrical, trachea midline and thyroid not enlarged, symmetric, no tenderness/mass/nodules Lymph nodes: Cervical,  supraclavicular, and axillary nodes normal. Resp: clear to auscultation bilaterally Cardio: regular rate and rhythm, S1, S2 normal, no murmur, click, rub or gallop GI: soft, non-tender; bowel sounds normal; no masses,  no organomegaly Extremities: extremities normal, atraumatic, no cyanosis or edema  ECOG PERFORMANCE STATUS: 1 - Symptomatic but completely ambulatory  Blood pressure 127/66, pulse 56, temperature 97.4 F (36.3 C), temperature source Oral, resp. rate 18, height 5\' 7"  (1.702 m), weight 175 lb 11.2 oz (79.697 kg), SpO2 99.00%.  LABORATORY DATA: Lab Results  Component Value Date   WBC 6.5 05/30/2013   HGB 12.0* 05/30/2013   HCT 36.7* 05/30/2013   MCV 94.6 05/30/2013   PLT 165 05/30/2013      Chemistry      Component Value Date/Time   NA 141 07/27/2012 1035   NA 140 03/19/2009 0932   K 4.4 07/27/2012 1035   K 4.5 03/19/2009 0932   CL 108* 07/27/2012 1035   CL 105 03/19/2009 0932   CO2 25 07/27/2012 1035   CO2 26 03/19/2009 0932   BUN 15.7 07/27/2012 1035   BUN 17 03/19/2009 0932   CREATININE 0.9 07/27/2012 1035   CREATININE 1.28 03/19/2009 0932      Component Value Date/Time   CALCIUM 9.2 07/27/2012 1035   CALCIUM 9.5 03/19/2009 0932   ALKPHOS 62 07/27/2012 1035   ALKPHOS 57 03/19/2009 0932   AST 21 07/27/2012 1035   AST 16 03/19/2009 0932   ALT 26 07/27/2012 1035   ALT 19 03/19/2009 0932   BILITOT 1.32* 07/27/2012 1035   BILITOT 1.0 03/19/2009 0932  RADIOGRAPHIC STUDIES:  ASSESSMENT AND PLAN: This is a very pleasant 68 years old white male recently diagnosed with questionable myelodysplastic syndrome, refractory anemia with ring sideroblasts. The patient is currently doing fine and asymptomatic except for the mild anemia. I recommended for him to continue on observation for now. I would see him back for followup visit in 6 months with repeat CBC and comprehensive metabolic panel. He was advised to call immediately if he has any concerning symptoms in the interval.  The  patient voices understanding of current disease status and treatment options and is in agreement with the current care plan.  All questions were answered. The patient knows to call the clinic with any problems, questions or concerns. We can certainly see the patient much sooner if necessary.  Disclaimer: This note was dictated with voice recognition software. Similar sounding words can inadvertently be transcribed and may not be corrected upon review.

## 2013-11-28 ENCOUNTER — Other Ambulatory Visit: Payer: Medicare Other

## 2013-11-28 ENCOUNTER — Ambulatory Visit: Payer: Medicare Other | Admitting: Internal Medicine

## 2016-03-07 DIAGNOSIS — G4731 Primary central sleep apnea: Secondary | ICD-10-CM | POA: Diagnosis not present

## 2016-03-07 DIAGNOSIS — D500 Iron deficiency anemia secondary to blood loss (chronic): Secondary | ICD-10-CM | POA: Diagnosis not present

## 2016-03-07 DIAGNOSIS — D696 Thrombocytopenia, unspecified: Secondary | ICD-10-CM | POA: Diagnosis not present

## 2016-03-07 DIAGNOSIS — D4620 Refractory anemia with excess of blasts, unspecified: Secondary | ICD-10-CM | POA: Diagnosis not present

## 2016-06-05 ENCOUNTER — Other Ambulatory Visit: Payer: Self-pay | Admitting: Family Medicine

## 2016-06-05 DIAGNOSIS — R0600 Dyspnea, unspecified: Secondary | ICD-10-CM

## 2016-06-06 ENCOUNTER — Ambulatory Visit (INDEPENDENT_AMBULATORY_CARE_PROVIDER_SITE_OTHER): Payer: BLUE CROSS/BLUE SHIELD | Admitting: Internal Medicine

## 2016-06-06 DIAGNOSIS — R0600 Dyspnea, unspecified: Secondary | ICD-10-CM

## 2016-06-06 LAB — PULMONARY FUNCTION TEST
DL/VA % pred: 90 %
DL/VA: 4.07 ml/min/mmHg/L
DLCO unc % pred: 79 %
DLCO unc: 23.5 ml/min/mmHg
FEF 25-75 Post: 4.13 L/sec
FEF 25-75 Pre: 3.26 L/sec
FEF2575-%Change-Post: 26 %
FEF2575-%PRED-PRE: 138 %
FEF2575-%Pred-Post: 175 %
FEV1-%Change-Post: 8 %
FEV1-%PRED-POST: 111 %
FEV1-%PRED-PRE: 102 %
FEV1-PRE: 3.13 L
FEV1-Post: 3.39 L
FEV1FVC-%Change-Post: 1 %
FEV1FVC-%Pred-Pre: 112 %
FEV6-%CHANGE-POST: 6 %
FEV6-%PRED-PRE: 96 %
FEV6-%Pred-Post: 102 %
FEV6-POST: 4.02 L
FEV6-PRE: 3.76 L
FEV6FVC-%PRED-PRE: 106 %
FEV6FVC-%Pred-Post: 106 %
FVC-%Change-Post: 6 %
FVC-%PRED-PRE: 90 %
FVC-%Pred-Post: 97 %
FVC-POST: 4.02 L
FVC-Pre: 3.76 L
POST FEV6/FVC RATIO: 100 %
Post FEV1/FVC ratio: 84 %
Pre FEV1/FVC ratio: 83 %
Pre FEV6/FVC Ratio: 100 %
RV % pred: 93 %
RV: 2.16 L
TLC % PRED: 89 %
TLC: 5.92 L

## 2016-06-06 NOTE — Progress Notes (Signed)
PFT done today. Shawn Bentley,CMA  

## 2016-06-17 ENCOUNTER — Telehealth: Payer: Self-pay | Admitting: Internal Medicine

## 2016-06-17 NOTE — Telephone Encounter (Signed)
Monica from [facility] [employer], which is where the pt is currently employed called for the results of the pt's PFT. Attempted to reach pt by both numbers listed but was unable to reach him. Will try to call pt again

## 2016-06-18 NOTE — Telephone Encounter (Signed)
LMTCB for the pt- need his permission to give results to employer

## 2016-06-19 NOTE — Telephone Encounter (Signed)
atc pt, unnamed male states that pt is unavailable at this time.  wcb.

## 2016-06-20 DIAGNOSIS — R42 Dizziness and giddiness: Secondary | ICD-10-CM | POA: Diagnosis not present

## 2016-06-20 DIAGNOSIS — R0989 Other specified symptoms and signs involving the circulatory and respiratory systems: Secondary | ICD-10-CM | POA: Diagnosis not present

## 2016-06-20 DIAGNOSIS — K219 Gastro-esophageal reflux disease without esophagitis: Secondary | ICD-10-CM | POA: Diagnosis not present

## 2016-06-20 DIAGNOSIS — R0789 Other chest pain: Secondary | ICD-10-CM | POA: Diagnosis not present

## 2016-06-20 DIAGNOSIS — R0602 Shortness of breath: Secondary | ICD-10-CM | POA: Diagnosis not present

## 2016-06-20 NOTE — Telephone Encounter (Signed)
Called the number listed for Macario Carls and this was [employer].  LMOM to make them aware that Ayan will need to call us back.

## 2016-06-20 NOTE — Telephone Encounter (Signed)
CY could you please advise of the interpretation of the PFT for this pt.  Thanks  Allergies  Allergen Reactions  . Sinus [Pseudoephedrine-Acetaminophen] Other (See Comments)    jittery

## 2016-06-20 NOTE — Telephone Encounter (Signed)
PFT was ordered by his PCP and result has been available to her since April.  Mild Diffusion defect, which can be caused by several different conditions. He should discuss with PCP>

## 2016-06-23 NOTE — Telephone Encounter (Signed)
Lm x2 for Shawn Bentley to have Putnam return our call.

## 2016-06-24 ENCOUNTER — Telehealth: Payer: Self-pay | Admitting: Internal Medicine

## 2016-06-24 NOTE — Telephone Encounter (Signed)
Spoke with Brayton Layman at [facility] (where pt's PCP Dr. Lisbeth Ply and Dr. Sheryn Bison practices), and spoke with Dr. Lisbeth Ply regarding CY's interpretion of PFT from 06/17/16: Mild Diffusion defect, which can be caused by several different conditions. He should discuss with PCP> ---------- PCP aware of recs.  Nothing further needed.

## 2016-06-25 NOTE — Telephone Encounter (Signed)
Attempted to contact the pt again but had to lmom of brandon x 4.  Will sign off per protocol.

## 2016-06-27 DIAGNOSIS — R0789 Other chest pain: Secondary | ICD-10-CM | POA: Diagnosis not present

## 2016-07-03 DIAGNOSIS — R0602 Shortness of breath: Secondary | ICD-10-CM | POA: Diagnosis not present

## 2016-07-03 DIAGNOSIS — R0989 Other specified symptoms and signs involving the circulatory and respiratory systems: Secondary | ICD-10-CM | POA: Diagnosis not present

## 2016-07-19 DIAGNOSIS — R42 Dizziness and giddiness: Secondary | ICD-10-CM | POA: Diagnosis not present

## 2016-07-23 DIAGNOSIS — D500 Iron deficiency anemia secondary to blood loss (chronic): Secondary | ICD-10-CM | POA: Diagnosis not present

## 2016-07-23 DIAGNOSIS — G4700 Insomnia, unspecified: Secondary | ICD-10-CM | POA: Diagnosis not present

## 2016-07-23 DIAGNOSIS — D4620 Refractory anemia with excess of blasts, unspecified: Secondary | ICD-10-CM | POA: Diagnosis not present

## 2016-07-23 DIAGNOSIS — G2581 Restless legs syndrome: Secondary | ICD-10-CM | POA: Diagnosis not present

## 2016-07-25 DIAGNOSIS — R0789 Other chest pain: Secondary | ICD-10-CM | POA: Diagnosis not present

## 2016-07-25 DIAGNOSIS — R0602 Shortness of breath: Secondary | ICD-10-CM | POA: Diagnosis not present

## 2016-07-25 DIAGNOSIS — R0989 Other specified symptoms and signs involving the circulatory and respiratory systems: Secondary | ICD-10-CM | POA: Diagnosis not present

## 2016-07-25 DIAGNOSIS — R42 Dizziness and giddiness: Secondary | ICD-10-CM | POA: Diagnosis not present

## 2016-11-28 DIAGNOSIS — D649 Anemia, unspecified: Secondary | ICD-10-CM | POA: Diagnosis not present

## 2016-11-28 DIAGNOSIS — D469 Myelodysplastic syndrome, unspecified: Secondary | ICD-10-CM | POA: Diagnosis not present

## 2016-11-28 DIAGNOSIS — D696 Thrombocytopenia, unspecified: Secondary | ICD-10-CM | POA: Diagnosis not present

## 2017-04-02 DIAGNOSIS — D4620 Refractory anemia with excess of blasts, unspecified: Secondary | ICD-10-CM | POA: Diagnosis not present

## 2017-04-02 DIAGNOSIS — D7589 Other specified diseases of blood and blood-forming organs: Secondary | ICD-10-CM | POA: Diagnosis not present

## 2017-04-02 DIAGNOSIS — D500 Iron deficiency anemia secondary to blood loss (chronic): Secondary | ICD-10-CM | POA: Diagnosis not present

## 2017-04-02 DIAGNOSIS — D696 Thrombocytopenia, unspecified: Secondary | ICD-10-CM | POA: Diagnosis not present

## 2017-04-02 DIAGNOSIS — K219 Gastro-esophageal reflux disease without esophagitis: Secondary | ICD-10-CM | POA: Diagnosis not present

## 2017-07-07 DIAGNOSIS — R1031 Right lower quadrant pain: Secondary | ICD-10-CM | POA: Diagnosis not present

## 2017-07-10 DIAGNOSIS — R1031 Right lower quadrant pain: Secondary | ICD-10-CM | POA: Diagnosis not present

## 2017-07-10 DIAGNOSIS — K4090 Unilateral inguinal hernia, without obstruction or gangrene, not specified as recurrent: Secondary | ICD-10-CM | POA: Diagnosis not present

## 2017-08-04 DIAGNOSIS — R252 Cramp and spasm: Secondary | ICD-10-CM | POA: Diagnosis not present

## 2017-08-04 DIAGNOSIS — D7589 Other specified diseases of blood and blood-forming organs: Secondary | ICD-10-CM | POA: Diagnosis not present

## 2017-08-04 DIAGNOSIS — K219 Gastro-esophageal reflux disease without esophagitis: Secondary | ICD-10-CM | POA: Diagnosis not present

## 2017-08-04 DIAGNOSIS — D4620 Refractory anemia with excess of blasts, unspecified: Secondary | ICD-10-CM | POA: Diagnosis not present

## 2017-08-04 DIAGNOSIS — D500 Iron deficiency anemia secondary to blood loss (chronic): Secondary | ICD-10-CM | POA: Diagnosis not present

## 2017-08-04 DIAGNOSIS — D696 Thrombocytopenia, unspecified: Secondary | ICD-10-CM | POA: Diagnosis not present

## 2017-08-07 DIAGNOSIS — D500 Iron deficiency anemia secondary to blood loss (chronic): Secondary | ICD-10-CM | POA: Diagnosis not present

## 2017-08-07 DIAGNOSIS — R252 Cramp and spasm: Secondary | ICD-10-CM | POA: Diagnosis not present

## 2017-08-07 DIAGNOSIS — D696 Thrombocytopenia, unspecified: Secondary | ICD-10-CM | POA: Diagnosis not present

## 2017-08-07 DIAGNOSIS — D7589 Other specified diseases of blood and blood-forming organs: Secondary | ICD-10-CM | POA: Diagnosis not present

## 2017-08-07 DIAGNOSIS — D4620 Refractory anemia with excess of blasts, unspecified: Secondary | ICD-10-CM | POA: Diagnosis not present

## 2017-08-07 DIAGNOSIS — K219 Gastro-esophageal reflux disease without esophagitis: Secondary | ICD-10-CM | POA: Diagnosis not present

## 2017-11-09 DIAGNOSIS — D7589 Other specified diseases of blood and blood-forming organs: Secondary | ICD-10-CM | POA: Diagnosis not present

## 2017-11-09 DIAGNOSIS — D4620 Refractory anemia with excess of blasts, unspecified: Secondary | ICD-10-CM | POA: Diagnosis not present

## 2017-11-09 DIAGNOSIS — D696 Thrombocytopenia, unspecified: Secondary | ICD-10-CM | POA: Diagnosis not present

## 2017-11-09 DIAGNOSIS — D500 Iron deficiency anemia secondary to blood loss (chronic): Secondary | ICD-10-CM | POA: Diagnosis not present

## 2017-12-29 ENCOUNTER — Other Ambulatory Visit: Payer: Self-pay | Admitting: Family Medicine

## 2017-12-29 DIAGNOSIS — M25511 Pain in right shoulder: Secondary | ICD-10-CM

## 2018-01-04 ENCOUNTER — Ambulatory Visit
Admission: RE | Admit: 2018-01-04 | Discharge: 2018-01-04 | Disposition: A | Discharge status: Home / Self Care | Payer: BLUE CROSS/BLUE SHIELD | Source: Ambulatory Visit | Attending: Family Medicine | Admitting: Family Medicine

## 2018-01-04 DIAGNOSIS — M25511 Pain in right shoulder: Secondary | ICD-10-CM | POA: Diagnosis not present

## 2018-01-06 DIAGNOSIS — M25511 Pain in right shoulder: Secondary | ICD-10-CM | POA: Diagnosis not present

## 2018-01-06 DIAGNOSIS — M7541 Impingement syndrome of right shoulder: Secondary | ICD-10-CM | POA: Diagnosis not present

## 2018-02-03 DIAGNOSIS — M7541 Impingement syndrome of right shoulder: Secondary | ICD-10-CM | POA: Diagnosis not present

## 2018-02-03 DIAGNOSIS — M25511 Pain in right shoulder: Secondary | ICD-10-CM | POA: Diagnosis not present

## 2018-02-26 DIAGNOSIS — M19011 Primary osteoarthritis, right shoulder: Secondary | ICD-10-CM | POA: Diagnosis not present

## 2018-02-26 DIAGNOSIS — S43431A Superior glenoid labrum lesion of right shoulder, initial encounter: Secondary | ICD-10-CM | POA: Diagnosis not present

## 2018-02-26 DIAGNOSIS — G8918 Other acute postprocedural pain: Secondary | ICD-10-CM | POA: Diagnosis not present

## 2018-02-26 DIAGNOSIS — M75111 Incomplete rotator cuff tear or rupture of right shoulder, not specified as traumatic: Secondary | ICD-10-CM | POA: Diagnosis not present

## 2018-02-26 DIAGNOSIS — M7521 Bicipital tendinitis, right shoulder: Secondary | ICD-10-CM | POA: Diagnosis not present

## 2018-02-26 DIAGNOSIS — M7541 Impingement syndrome of right shoulder: Secondary | ICD-10-CM | POA: Diagnosis not present

## 2018-02-26 DIAGNOSIS — S43491D Other sprain of right shoulder joint, subsequent encounter: Secondary | ICD-10-CM | POA: Diagnosis not present

## 2018-02-26 DIAGNOSIS — M659 Synovitis and tenosynovitis, unspecified: Secondary | ICD-10-CM | POA: Diagnosis not present

## 2018-02-28 DIAGNOSIS — M7541 Impingement syndrome of right shoulder: Secondary | ICD-10-CM | POA: Diagnosis not present

## 2018-02-28 DIAGNOSIS — Z4889 Encounter for other specified surgical aftercare: Secondary | ICD-10-CM | POA: Diagnosis not present

## 2018-02-28 DIAGNOSIS — R600 Localized edema: Secondary | ICD-10-CM | POA: Diagnosis not present

## 2018-02-28 DIAGNOSIS — M25511 Pain in right shoulder: Secondary | ICD-10-CM | POA: Diagnosis not present

## 2018-03-03 DIAGNOSIS — M7541 Impingement syndrome of right shoulder: Secondary | ICD-10-CM | POA: Diagnosis not present

## 2018-03-03 DIAGNOSIS — R600 Localized edema: Secondary | ICD-10-CM | POA: Diagnosis not present

## 2018-03-03 DIAGNOSIS — Z4889 Encounter for other specified surgical aftercare: Secondary | ICD-10-CM | POA: Diagnosis not present

## 2018-03-03 DIAGNOSIS — M25511 Pain in right shoulder: Secondary | ICD-10-CM | POA: Diagnosis not present

## 2018-03-11 DIAGNOSIS — D7589 Other specified diseases of blood and blood-forming organs: Secondary | ICD-10-CM | POA: Diagnosis not present

## 2018-03-11 DIAGNOSIS — D4620 Refractory anemia with excess of blasts, unspecified: Secondary | ICD-10-CM | POA: Diagnosis not present

## 2018-03-11 DIAGNOSIS — D72820 Lymphocytosis (symptomatic): Secondary | ICD-10-CM | POA: Diagnosis not present

## 2018-03-11 DIAGNOSIS — D696 Thrombocytopenia, unspecified: Secondary | ICD-10-CM | POA: Diagnosis not present

## 2018-03-11 DIAGNOSIS — D539 Nutritional anemia, unspecified: Secondary | ICD-10-CM | POA: Diagnosis not present

## 2018-03-15 DIAGNOSIS — M25511 Pain in right shoulder: Secondary | ICD-10-CM | POA: Diagnosis not present

## 2018-03-15 DIAGNOSIS — M25611 Stiffness of right shoulder, not elsewhere classified: Secondary | ICD-10-CM | POA: Diagnosis not present

## 2018-03-22 DIAGNOSIS — M25511 Pain in right shoulder: Secondary | ICD-10-CM | POA: Diagnosis not present

## 2018-03-22 DIAGNOSIS — M25611 Stiffness of right shoulder, not elsewhere classified: Secondary | ICD-10-CM | POA: Diagnosis not present

## 2018-03-29 DIAGNOSIS — M25511 Pain in right shoulder: Secondary | ICD-10-CM | POA: Diagnosis not present

## 2018-04-06 DIAGNOSIS — M25511 Pain in right shoulder: Secondary | ICD-10-CM | POA: Diagnosis not present

## 2018-04-06 DIAGNOSIS — M25611 Stiffness of right shoulder, not elsewhere classified: Secondary | ICD-10-CM | POA: Diagnosis not present

## 2018-04-12 DIAGNOSIS — M25511 Pain in right shoulder: Secondary | ICD-10-CM | POA: Diagnosis not present

## 2018-04-12 DIAGNOSIS — M25611 Stiffness of right shoulder, not elsewhere classified: Secondary | ICD-10-CM | POA: Diagnosis not present

## 2018-04-19 DIAGNOSIS — M25611 Stiffness of right shoulder, not elsewhere classified: Secondary | ICD-10-CM | POA: Diagnosis not present

## 2018-04-22 DIAGNOSIS — M25511 Pain in right shoulder: Secondary | ICD-10-CM | POA: Diagnosis not present

## 2018-04-22 DIAGNOSIS — M25611 Stiffness of right shoulder, not elsewhere classified: Secondary | ICD-10-CM | POA: Diagnosis not present

## 2018-04-26 DIAGNOSIS — M25611 Stiffness of right shoulder, not elsewhere classified: Secondary | ICD-10-CM | POA: Diagnosis not present

## 2018-04-29 DIAGNOSIS — M25511 Pain in right shoulder: Secondary | ICD-10-CM | POA: Diagnosis not present

## 2018-07-09 DIAGNOSIS — D4620 Refractory anemia with excess of blasts, unspecified: Secondary | ICD-10-CM | POA: Diagnosis not present

## 2018-07-09 DIAGNOSIS — D7589 Other specified diseases of blood and blood-forming organs: Secondary | ICD-10-CM | POA: Diagnosis not present

## 2018-07-09 DIAGNOSIS — D696 Thrombocytopenia, unspecified: Secondary | ICD-10-CM | POA: Diagnosis not present

## 2018-07-09 DIAGNOSIS — K219 Gastro-esophageal reflux disease without esophagitis: Secondary | ICD-10-CM | POA: Diagnosis not present

## 2018-09-29 DIAGNOSIS — H6123 Impacted cerumen, bilateral: Secondary | ICD-10-CM | POA: Diagnosis not present

## 2018-09-29 DIAGNOSIS — H6993 Unspecified Eustachian tube disorder, bilateral: Secondary | ICD-10-CM | POA: Diagnosis not present

## 2018-09-29 DIAGNOSIS — J342 Deviated nasal septum: Secondary | ICD-10-CM | POA: Diagnosis not present

## 2018-09-29 DIAGNOSIS — H60333 Swimmer's ear, bilateral: Secondary | ICD-10-CM | POA: Diagnosis not present

## 2019-01-12 ENCOUNTER — Other Ambulatory Visit: Payer: Self-pay

## 2019-01-12 ENCOUNTER — Other Ambulatory Visit: Payer: Self-pay | Admitting: Obstetrics and Gynecology

## 2019-01-12 ENCOUNTER — Ambulatory Visit
Admission: RE | Admit: 2019-01-12 | Discharge: 2019-01-12 | Disposition: A | Discharge status: Home / Self Care | Payer: BC Managed Care – PPO | Source: Ambulatory Visit | Attending: Obstetrics and Gynecology | Admitting: Obstetrics and Gynecology

## 2019-01-12 DIAGNOSIS — R0602 Shortness of breath: Secondary | ICD-10-CM

## 2019-01-13 ENCOUNTER — Other Ambulatory Visit: Payer: Self-pay | Admitting: Obstetrics and Gynecology

## 2019-01-13 DIAGNOSIS — R5383 Other fatigue: Secondary | ICD-10-CM

## 2019-01-17 ENCOUNTER — Other Ambulatory Visit: Payer: Self-pay | Admitting: Obstetrics and Gynecology

## 2019-01-17 DIAGNOSIS — Z87891 Personal history of nicotine dependence: Secondary | ICD-10-CM

## 2019-01-18 DIAGNOSIS — R5383 Other fatigue: Secondary | ICD-10-CM | POA: Diagnosis not present

## 2019-01-18 DIAGNOSIS — D4620 Refractory anemia with excess of blasts, unspecified: Secondary | ICD-10-CM | POA: Diagnosis not present

## 2019-01-18 DIAGNOSIS — D7589 Other specified diseases of blood and blood-forming organs: Secondary | ICD-10-CM | POA: Diagnosis not present

## 2019-01-18 DIAGNOSIS — D696 Thrombocytopenia, unspecified: Secondary | ICD-10-CM | POA: Diagnosis not present

## 2019-01-18 DIAGNOSIS — Z79899 Other long term (current) drug therapy: Secondary | ICD-10-CM | POA: Diagnosis not present

## 2019-01-20 ENCOUNTER — Ambulatory Visit (INDEPENDENT_AMBULATORY_CARE_PROVIDER_SITE_OTHER): Payer: BC Managed Care – PPO | Admitting: Cardiology

## 2019-01-20 ENCOUNTER — Other Ambulatory Visit: Payer: Self-pay

## 2019-01-20 ENCOUNTER — Encounter: Payer: Self-pay | Admitting: Cardiology

## 2019-01-20 VITALS — BP 133/60 | HR 71 | Ht 67.0 in | Wt 182.7 lb

## 2019-01-20 DIAGNOSIS — D4620 Refractory anemia with excess of blasts, unspecified: Secondary | ICD-10-CM | POA: Diagnosis not present

## 2019-01-20 DIAGNOSIS — R0609 Other forms of dyspnea: Secondary | ICD-10-CM | POA: Diagnosis not present

## 2019-01-20 DIAGNOSIS — R5383 Other fatigue: Secondary | ICD-10-CM

## 2019-01-20 DIAGNOSIS — R0600 Dyspnea, unspecified: Secondary | ICD-10-CM

## 2019-01-20 NOTE — Progress Notes (Signed)
Primary Physician:  Geannie Risen, MD   Patient ID: Shawn Bentley, male    DOB: 1945/04/11, 73 y.o.   MRN: [id number]  Subjective:    Chief Complaint  Patient presents with  . Abnormal ECG  . Follow-up    HPI: Shawn Bentley  is a 73 y.o. male  with GERD, arthritis, anemia, and sleep apnea.  Underwent echocardiogram in 2018 that was essentially normal. Carotid duplex at that time showed minimal bilateral carotid disease. He was scheduled for treadmill stress testing, but was lost to follow up.  He reports over the last 6 weeks he has been feeling extremely tired and does not have the energy to do his normal activities.  Gets very short of breath with activities.  No leg edema or changes in his weight.  He has not had any exertional chest pain.  No palpitations.  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 He is to have repeat bone marrow study in the next few weeks. He reports that this has been stable.   Former smoker for a total of 4-5 years. He stopped at age 40.  Past Medical History:  Diagnosis Date  . Anemia   . Arthritis   . GERD (gastroesophageal reflux disease)     Past Surgical History:  Procedure Laterality Date  . APPENDECTOMY    . HERNIA REPAIR      Social History   Socioeconomic History  . Marital status: Married    Spouse name: Not on file  . Number of children: 3  . Years of education: Not on file  . Highest education level: Not on file  Occupational History  . Not on file  Social Needs  . Financial resource strain: Not on file  . Food insecurity    Worry: Not on file    Inability: Not on file  . Transportation needs    Medical: Not on file    Non-medical: Not on file  Tobacco Use  . Smoking status: Former Smoker    Packs/day: 1.50    Years: 4.00    Pack years: 6.00    Types: Cigarettes    Quit date: 07/27/1972    Years since quitting: 46.5  . Smokeless  tobacco: Former Systems developer    Quit date: 07/27/1972  Substance and Sexual Activity  . Alcohol use: Yes    Alcohol/week: 0.0 standard drinks  . Drug use: Not on file  . Sexual activity: Not on file  Lifestyle  . Physical activity    Days per week: Not on file    Minutes per session: Not on file  . Stress: Not on file  Relationships  . Social Herbalist on phone: Not on file    Gets together: Not on file    Attends religious service: Not on file    Active member of club or organization: Not on file    Attends meetings of clubs or organizations: Not on file    Relationship status: Not on file  . Intimate partner violence    Fear of current or ex partner: Not on file    Emotionally abused: Not on file    Physically abused: Not on file    Forced sexual activity: Not on file  Other Topics Concern  . Not on file  Social History Narrative  . Not on file    Review of Systems  Constitution: Positive for malaise/fatigue. Negative for decreased appetite, weight  gain and weight loss.  Eyes: Negative for visual disturbance.  Cardiovascular: Positive for dyspnea on exertion. Negative for chest pain, claudication, leg swelling, orthopnea, palpitations and syncope.  Respiratory: Negative for hemoptysis and wheezing.   Endocrine: Negative for cold intolerance and heat intolerance.  Hematologic/Lymphatic: Does not bruise/bleed easily.  Skin: Negative for nail changes.  Musculoskeletal: Negative for muscle weakness and myalgias.  Gastrointestinal: Negative for abdominal pain, change in bowel habit, nausea and vomiting.  Neurological: Negative for difficulty with concentration, dizziness, focal weakness and headaches.  Psychiatric/Behavioral: Negative for altered mental status and suicidal ideas.  All other systems reviewed and are negative.     Objective:  Blood pressure 133/60, pulse 71, height 5\' 7"  (1.702 m), weight 182 lb 11.2 oz (82.9 kg), SpO2 100 %. Body mass index is 28.61  kg/m.    Physical Exam  Constitutional: He is oriented to person, place, and time. Vital signs are normal. He appears well-developed and well-nourished.  HENT:  Head: Normocephalic and atraumatic.  Neck: Normal range of motion.  Cardiovascular: Normal rate, regular rhythm, normal heart sounds and intact distal pulses.  Pulses:      Carotid pulses are on the right side with bruit. Pulmonary/Chest: Effort normal and breath sounds normal. No accessory muscle usage. No respiratory distress.  Abdominal: Soft. Bowel sounds are normal.  Musculoskeletal: Normal range of motion.  Neurological: He is alert and oriented to person, place, and time.  Skin: Skin is warm and dry.  Vitals reviewed.  Radiology: No results found.  Laboratory examination:   06/27/2016: Cholesterol 88, triglycerides 49, HDL 34, LDL 44.  CMP Latest Ref Rng & Units 05/30/2013 07/27/2012 03/19/2009  Glucose 70 - 140 mg/dl 96 95 77  BUN 7.0 - 26.0 mg/dL 17.1 15.7 17  Creatinine 0.7 - 1.3 mg/dL 1.0 0.9 1.28  Sodium 136 - 145 mEq/L 139 141 140  Potassium 3.5 - 5.1 mEq/L 4.5 4.4 4.5  Chloride 98 - 107 mEq/L - 108(H) 105  CO2 22 - 29 mEq/L 23 25 26   Calcium 8.4 - 10.4 mg/dL 9.3 9.2 9.5  Total Protein 6.4 - 8.3 g/dL 6.9 6.4 6.8  Total Bilirubin 0.20 - 1.20 mg/dL 1.62(H) 1.32(H) 1.0  Alkaline Phos 40 - 150 U/L 64 62 57  AST 5 - 34 U/L 20 21 16   ALT 0 - 55 U/L 28 26 19    CBC Latest Ref Rng & Units 05/30/2013 11/08/2012 11/03/2012  WBC 4.0 - 10.3 10e3/uL 6.5 4.7 6.4  Hemoglobin 13.0 - 17.1 g/dL 12.0(L) 11.2(L) 11.0(L)  Hematocrit 38.4 - 49.9 % 36.7(L) 31.9(L) 32.5(L)  Platelets 140 - 400 10e3/uL 165 133(L) 195   Lipid Panel  No results found for: CHOL, TRIG, HDL, CHOLHDL, VLDL, LDLCALC, LDLDIRECT HEMOGLOBIN A1C No results found for: HGBA1C, MPG TSH No results for input(s): TSH in the last 8760 hours.  PRN Meds:. Medications Discontinued During This Encounter  Medication Reason  . diphenhydrAMINE (BENADRYL) 25 mg  capsule Error   Current Meds  Medication Sig  . omeprazole (PRILOSEC) 20 MG capsule Take 20 mg by mouth daily.   Marland Kitchen rOPINIRole (REQUIP) 0.5 MG tablet Take 0.5 mg by mouth at bedtime as needed (for restless leg syndrome).     Cardiac Studies:   Carotid artery duplex 2016/07/15: Minimal stenosis in the right internal carotid artery (1-15%). Mild stenosis in the right external carotid artery (<50%). Antegrade vertebral artery flow.  Echocardiogram 15-Jul-2016: Left ventricle cavity is normal in size. Normal global wall motion. Normal diastolic filling pattern,  normal LAP. Calculated EF 66%. Trace aortic regurgitation. Trace mitral regurgitation. Trace tricuspid regurgitation. PA pressure upper limit of normal. Pulmonary artery systolic pressure is estimated at 31 mm Hg. Trace pulmonic regurgitation.  Assessment:   Dyspnea on exertion - Plan: PCV ECHOCARDIOGRAM COMPLETE, PCV MYOCARDIAL PERFUSION WITH LEXISCAN  Fatigue, unspecified type - Plan: EKG 12-Lead  EKG 01/20/2019: Normal sinus rhythm at 65 bpm, normal axis, no evidence of ischemia.    Recommendations:   Patient was last seen 2 years ago, was lost to follow-up.  Over the last 6 weeks has had dyspnea on exertion and fatigue.  States that he does not have the energy to do his activities as a months could.  He has not had any exertional chest discomfort.  Given his symptoms, will schedule for walking Lexiscan nuclear stress testing.  He is unable to fully complete treadmill stress testing, as he states he has cramping in his legs with walking on the treadmill.  May need further evaluation for PAD, but he states that this only occurs with walking on the treadmill.  I will also repeat his echocardiogram to exclude any structural abnormalities.  Blood pressure is well controlled.  I will request recent labs from his PCP office.  Physical exam and EKG are with without any acute abnormalities.  I will see him back after the test for further  recommendations and reevaluation.  Miquel Dunn, MSN, APRN, FNP-C [facility]. [place] Office: [phone] Fax: [phone]

## 2019-01-24 ENCOUNTER — Ambulatory Visit
Admission: RE | Admit: 2019-01-24 | Discharge: 2019-01-24 | Disposition: A | Discharge status: Home / Self Care | Payer: BC Managed Care – PPO | Source: Ambulatory Visit | Attending: Obstetrics and Gynecology | Admitting: Obstetrics and Gynecology

## 2019-01-24 DIAGNOSIS — Z136 Encounter for screening for cardiovascular disorders: Secondary | ICD-10-CM | POA: Diagnosis not present

## 2019-01-24 DIAGNOSIS — Z87891 Personal history of nicotine dependence: Secondary | ICD-10-CM | POA: Diagnosis not present

## 2019-01-25 ENCOUNTER — Other Ambulatory Visit: Payer: Medicare HMO

## 2019-01-26 ENCOUNTER — Ambulatory Visit (INDEPENDENT_AMBULATORY_CARE_PROVIDER_SITE_OTHER): Payer: BC Managed Care – PPO

## 2019-01-26 ENCOUNTER — Other Ambulatory Visit: Payer: Self-pay

## 2019-01-26 DIAGNOSIS — R0609 Other forms of dyspnea: Secondary | ICD-10-CM | POA: Diagnosis not present

## 2019-01-26 DIAGNOSIS — R0600 Dyspnea, unspecified: Secondary | ICD-10-CM

## 2019-01-31 NOTE — Progress Notes (Signed)
Does patient need one?

## 2019-01-31 NOTE — Progress Notes (Signed)
Shawn handle this

## 2019-02-01 ENCOUNTER — Other Ambulatory Visit: Payer: Self-pay

## 2019-02-01 DIAGNOSIS — D4620 Refractory anemia with excess of blasts, unspecified: Secondary | ICD-10-CM | POA: Diagnosis not present

## 2019-02-01 DIAGNOSIS — D479 Neoplasm of uncertain behavior of lymphoid, hematopoietic and related tissue, unspecified: Secondary | ICD-10-CM | POA: Diagnosis not present

## 2019-02-01 DIAGNOSIS — D7589 Other specified diseases of blood and blood-forming organs: Secondary | ICD-10-CM | POA: Diagnosis not present

## 2019-02-01 DIAGNOSIS — D696 Thrombocytopenia, unspecified: Secondary | ICD-10-CM | POA: Diagnosis not present

## 2019-02-02 NOTE — Progress Notes (Signed)
Pt says that he is not concerned he says his symptoms went away; He will call if he needs [place]

## 2019-02-14 ENCOUNTER — Other Ambulatory Visit: Payer: Self-pay

## 2019-02-14 ENCOUNTER — Other Ambulatory Visit: Payer: Self-pay | Admitting: Obstetrics and Gynecology

## 2019-02-14 ENCOUNTER — Ambulatory Visit
Admission: RE | Admit: 2019-02-14 | Discharge: 2019-02-14 | Disposition: A | Discharge status: Home / Self Care | Payer: BC Managed Care – PPO | Source: Ambulatory Visit | Attending: Obstetrics and Gynecology | Admitting: Obstetrics and Gynecology

## 2019-02-14 DIAGNOSIS — M545 Low back pain, unspecified: Secondary | ICD-10-CM

## 2019-02-23 DIAGNOSIS — D47Z9 Other specified neoplasms of uncertain behavior of lymphoid, hematopoietic and related tissue: Secondary | ICD-10-CM | POA: Diagnosis not present

## 2019-02-23 DIAGNOSIS — D696 Thrombocytopenia, unspecified: Secondary | ICD-10-CM | POA: Diagnosis not present

## 2019-02-23 DIAGNOSIS — Z87891 Personal history of nicotine dependence: Secondary | ICD-10-CM | POA: Diagnosis not present

## 2019-02-23 DIAGNOSIS — K2270 Barrett's esophagus without dysplasia: Secondary | ICD-10-CM | POA: Diagnosis not present

## 2019-02-23 DIAGNOSIS — D7589 Other specified diseases of blood and blood-forming organs: Secondary | ICD-10-CM | POA: Diagnosis not present

## 2019-02-23 DIAGNOSIS — Z79899 Other long term (current) drug therapy: Secondary | ICD-10-CM | POA: Diagnosis not present

## 2019-02-23 DIAGNOSIS — D4620 Refractory anemia with excess of blasts, unspecified: Secondary | ICD-10-CM | POA: Diagnosis not present

## 2019-03-03 DIAGNOSIS — Z87891 Personal history of nicotine dependence: Secondary | ICD-10-CM | POA: Diagnosis not present

## 2019-03-03 DIAGNOSIS — D7589 Other specified diseases of blood and blood-forming organs: Secondary | ICD-10-CM | POA: Diagnosis not present

## 2019-03-03 DIAGNOSIS — D4620 Refractory anemia with excess of blasts, unspecified: Secondary | ICD-10-CM | POA: Diagnosis not present

## 2019-03-03 DIAGNOSIS — D47Z9 Other specified neoplasms of uncertain behavior of lymphoid, hematopoietic and related tissue: Secondary | ICD-10-CM | POA: Diagnosis not present

## 2019-03-03 DIAGNOSIS — D696 Thrombocytopenia, unspecified: Secondary | ICD-10-CM | POA: Diagnosis not present

## 2019-03-03 DIAGNOSIS — K2270 Barrett's esophagus without dysplasia: Secondary | ICD-10-CM | POA: Diagnosis not present

## 2019-03-03 DIAGNOSIS — Z79899 Other long term (current) drug therapy: Secondary | ICD-10-CM | POA: Diagnosis not present

## 2019-05-27 DIAGNOSIS — D4620 Refractory anemia with excess of blasts, unspecified: Secondary | ICD-10-CM | POA: Diagnosis not present

## 2019-08-22 ENCOUNTER — Other Ambulatory Visit: Payer: Self-pay

## 2019-08-22 ENCOUNTER — Ambulatory Visit
Admission: RE | Admit: 2019-08-22 | Discharge: 2019-08-22 | Disposition: A | Discharge status: Home / Self Care | Payer: BLUE CROSS/BLUE SHIELD | Source: Ambulatory Visit | Attending: Obstetrics and Gynecology | Admitting: Obstetrics and Gynecology

## 2019-08-22 ENCOUNTER — Other Ambulatory Visit: Payer: Self-pay | Admitting: Obstetrics and Gynecology

## 2019-08-22 DIAGNOSIS — M5136 Other intervertebral disc degeneration, lumbar region: Secondary | ICD-10-CM | POA: Diagnosis not present

## 2019-08-22 DIAGNOSIS — R52 Pain, unspecified: Secondary | ICD-10-CM

## 2019-08-22 DIAGNOSIS — M25552 Pain in left hip: Secondary | ICD-10-CM | POA: Diagnosis not present

## 2019-08-26 DIAGNOSIS — G4730 Sleep apnea, unspecified: Secondary | ICD-10-CM | POA: Diagnosis not present

## 2019-08-26 DIAGNOSIS — Z791 Long term (current) use of non-steroidal anti-inflammatories (NSAID): Secondary | ICD-10-CM | POA: Diagnosis not present

## 2019-08-26 DIAGNOSIS — D4620 Refractory anemia with excess of blasts, unspecified: Secondary | ICD-10-CM | POA: Diagnosis not present

## 2019-08-26 DIAGNOSIS — M1990 Unspecified osteoarthritis, unspecified site: Secondary | ICD-10-CM | POA: Diagnosis not present

## 2019-08-26 DIAGNOSIS — Z79899 Other long term (current) drug therapy: Secondary | ICD-10-CM | POA: Diagnosis not present

## 2019-08-26 DIAGNOSIS — K219 Gastro-esophageal reflux disease without esophagitis: Secondary | ICD-10-CM | POA: Diagnosis not present

## 2019-08-26 DIAGNOSIS — Z87891 Personal history of nicotine dependence: Secondary | ICD-10-CM | POA: Diagnosis not present

## 2019-11-23 DIAGNOSIS — X58XXXA Exposure to other specified factors, initial encounter: Secondary | ICD-10-CM | POA: Diagnosis not present

## 2019-11-23 DIAGNOSIS — H5410 Blindness, one eye, low vision other eye, unspecified eyes: Secondary | ICD-10-CM | POA: Diagnosis not present

## 2019-11-23 DIAGNOSIS — H53149 Visual discomfort, unspecified: Secondary | ICD-10-CM | POA: Diagnosis not present

## 2019-11-23 DIAGNOSIS — H5711 Ocular pain, right eye: Secondary | ICD-10-CM | POA: Diagnosis not present

## 2019-11-23 DIAGNOSIS — M1990 Unspecified osteoarthritis, unspecified site: Secondary | ICD-10-CM | POA: Diagnosis not present

## 2019-11-23 DIAGNOSIS — T2690XA Corrosion of unspecified eye and adnexa, part unspecified, initial encounter: Secondary | ICD-10-CM | POA: Diagnosis not present

## 2019-11-23 DIAGNOSIS — H538 Other visual disturbances: Secondary | ICD-10-CM | POA: Diagnosis not present

## 2019-11-23 DIAGNOSIS — H53141 Visual discomfort, right eye: Secondary | ICD-10-CM | POA: Diagnosis not present

## 2019-11-23 DIAGNOSIS — Z87891 Personal history of nicotine dependence: Secondary | ICD-10-CM | POA: Diagnosis not present

## 2019-11-23 DIAGNOSIS — Z77098 Contact with and (suspected) exposure to other hazardous, chiefly nonmedicinal, chemicals: Secondary | ICD-10-CM | POA: Diagnosis not present

## 2019-11-23 DIAGNOSIS — Z888 Allergy status to other drugs, medicaments and biological substances status: Secondary | ICD-10-CM | POA: Diagnosis not present

## 2019-11-23 DIAGNOSIS — K219 Gastro-esophageal reflux disease without esophagitis: Secondary | ICD-10-CM | POA: Diagnosis not present

## 2019-11-29 DIAGNOSIS — T2611XD Burn of cornea and conjunctival sac, right eye, subsequent encounter: Secondary | ICD-10-CM | POA: Diagnosis not present

## 2020-06-01 IMAGING — MR MR SHOULDER*R* W/O CM
4 of 5 series · 19 of 40 positions shown · non-contrast
Comparison: None.

CLINICAL DATA: Chronic right shoulder pain with limited range of
motion.

EXAM:
MRI OF THE RIGHT SHOULDER WITHOUT CONTRAST
TECHNIQUE: Multiplanar, multisequence MR imaging of the shoulder was performed.
No intravenous contrast was administered.

[Series 6: PD fat-sat · axial · right · 4.0mm · 0.44mm/px · z∈[-51,+37]mm · 6 of 20 slices shown (1 of 2)]
[im 1/20]
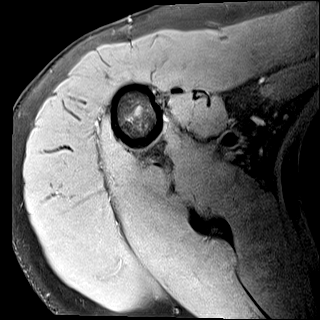
[im 4/20]
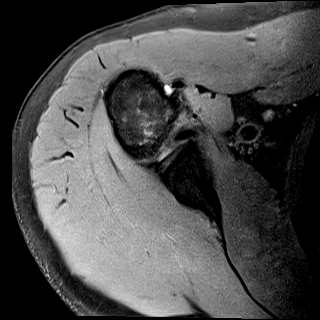
[im 8/20]
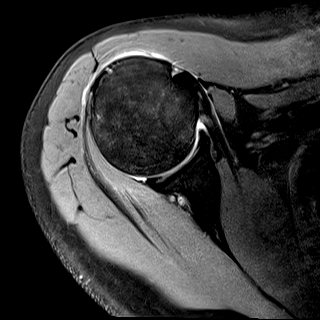
[im 12/20]
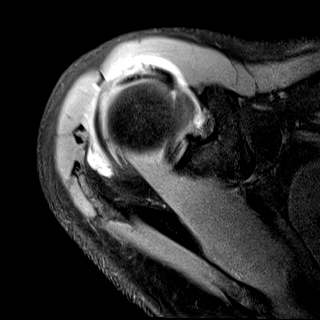
[im 16/20]
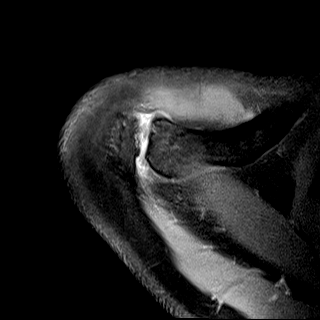
[im 20/20]
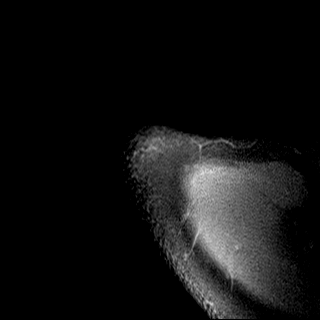

[Series 7: T2 fat-sat · oblique · right · 4.0mm · 0.22mm/px · 3 of 21 slices shown (1 of 2)]
[im 3/21]
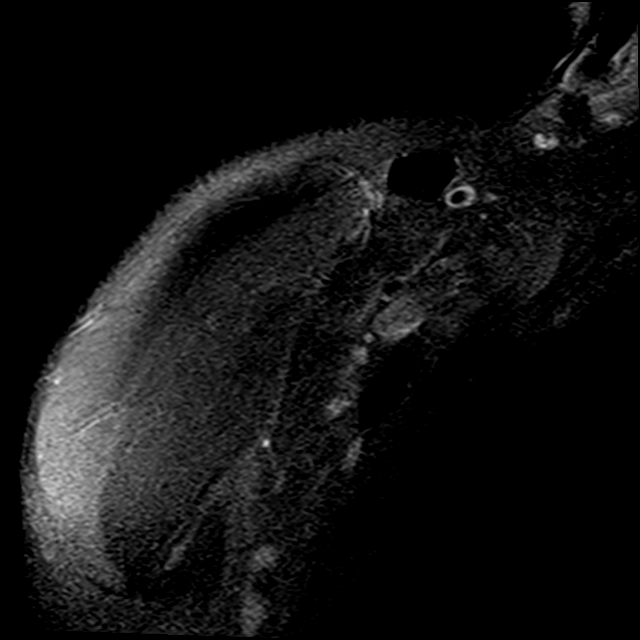
[im 12/21]
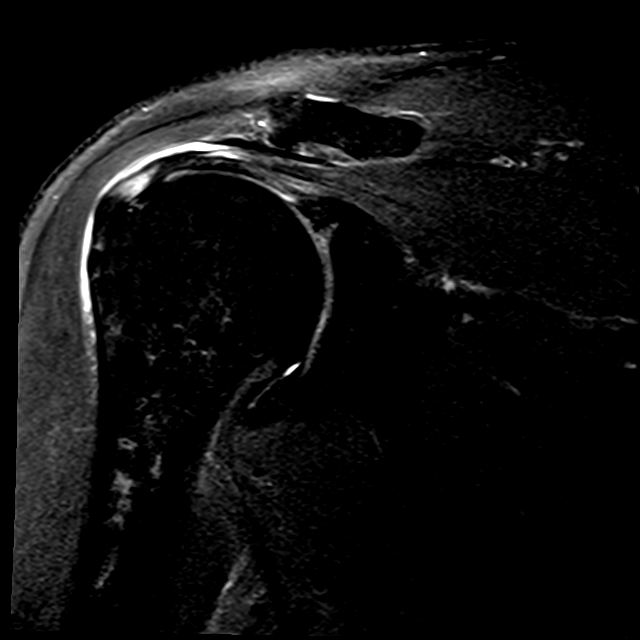
[im 18/21]
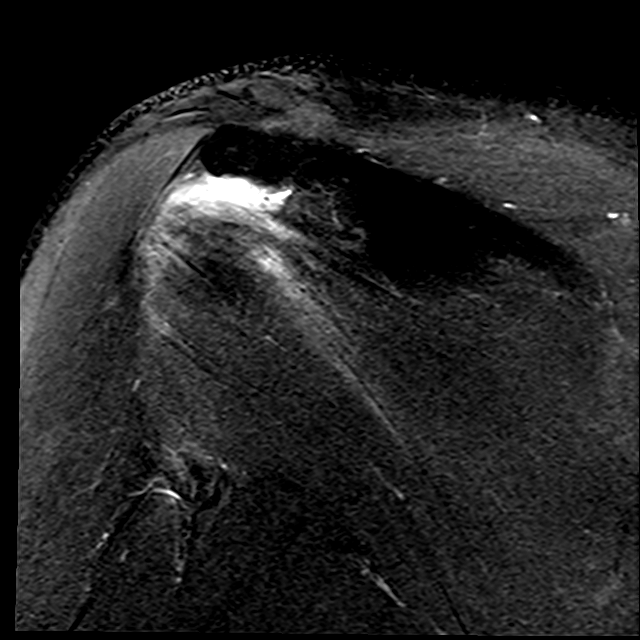

[Series 8: PD fat-sat · oblique · right · 4.0mm · 0.22mm/px · 7 of 21 slices shown (2 of 2)]
[im 1/21]
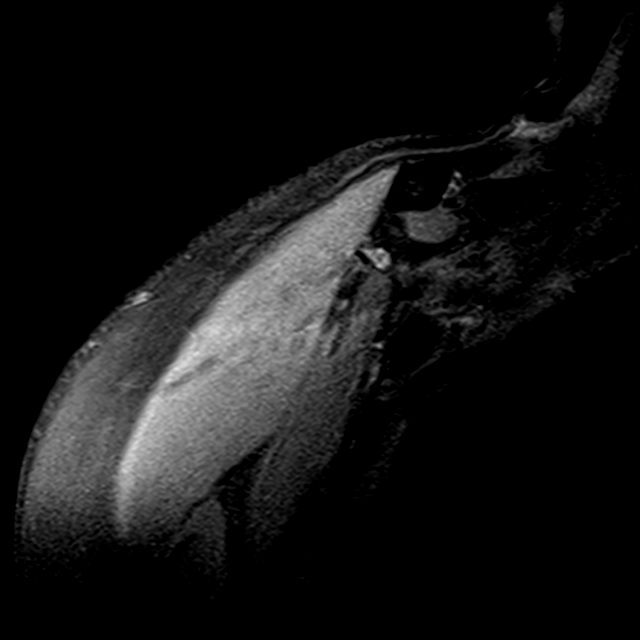
[im 3/21]
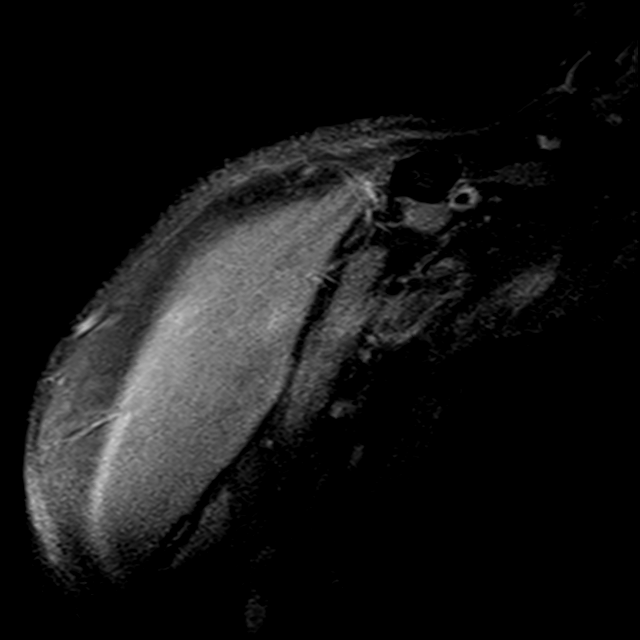
[im 6/21]
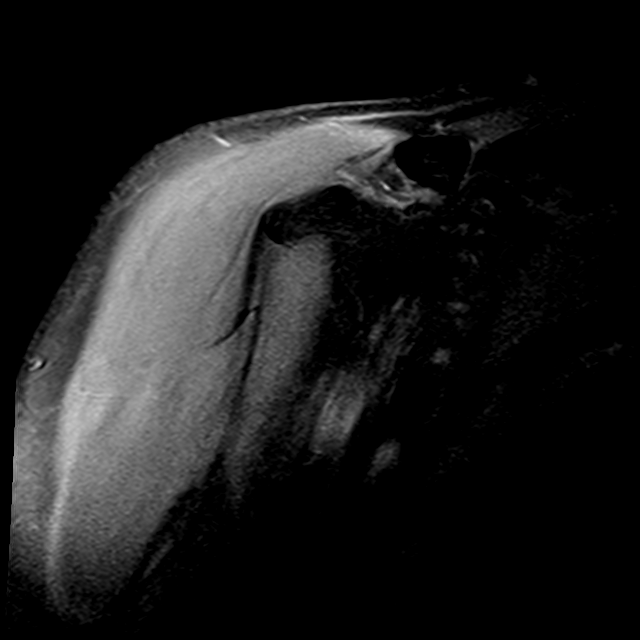
[im 9/21]
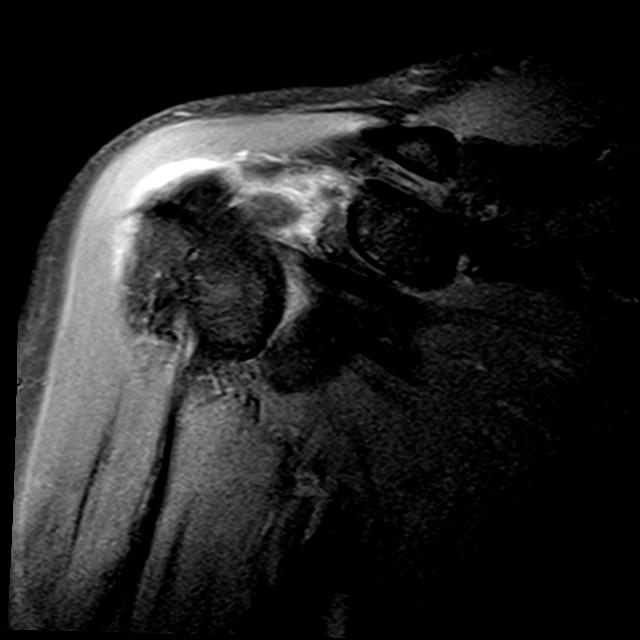
[im 12/21]
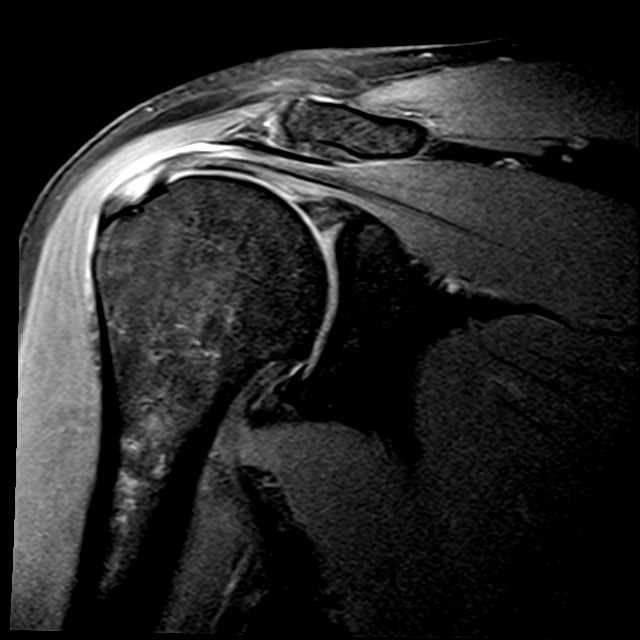
[im 15/21]
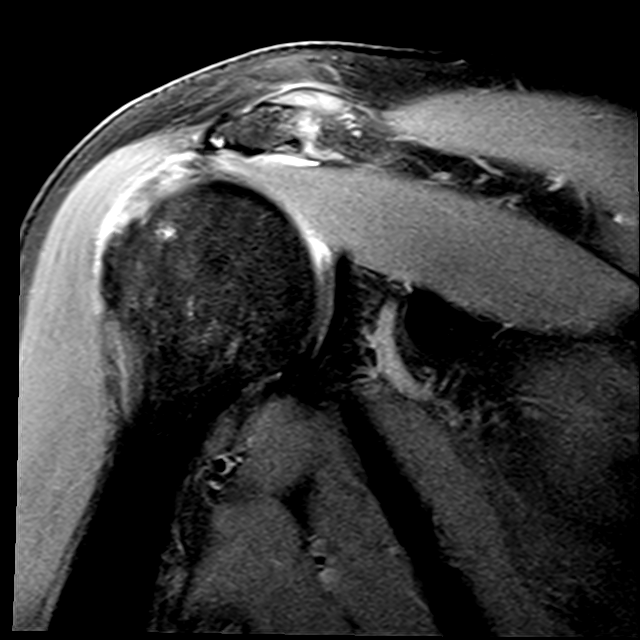
[im 18/21]
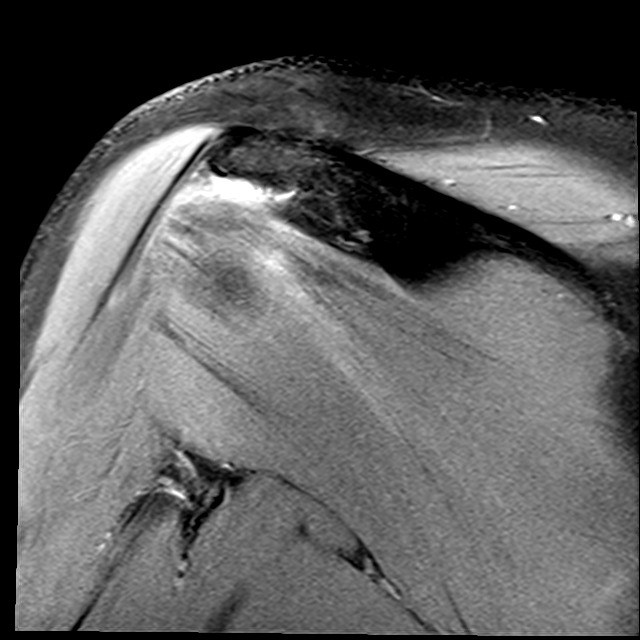

[Series 11: T2 fat-sat · coronal · right · 4.0mm · 0.44mm/px · 3 of 23 slices shown (2 of 2)]
[im 3/23]
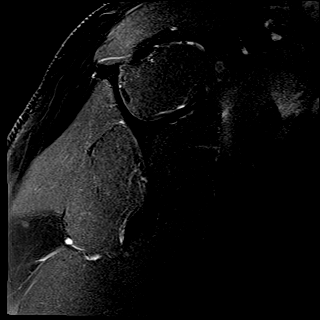
[im 12/23]
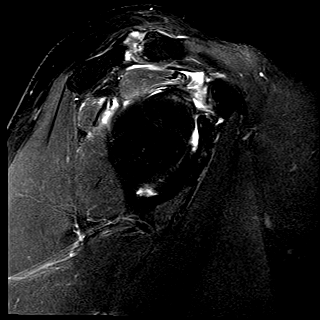
[im 20/23]
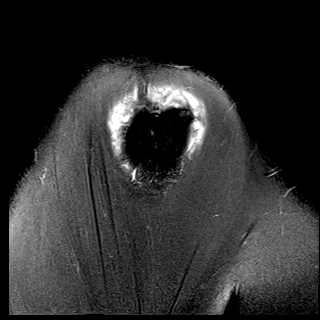

[19 of 40 positions shown; findings below may reference images not displayed]

FINDINGS: Rotator cuff: Moderate supraspinatus and infraspinatus tendinosis.
Articular surface fraying of the supraspinatus tendon. Small
interstitial tear along the infraspinatus myotendinous junction.
Mild subscapularis tendinosis. The teres minor tendon is
unremarkable.

Muscles: No atrophy or abnormal signal of the muscles of the rotator
cuff.

Biceps long head: Intact and normally positioned. Mild
intra-articular tendinosis.

Acromioclavicular Joint: Mild arthropathy of the acromioclavicular
joint. Type I acromion. Small subacromial/subdeltoid bursal fluid.

Glenohumeral Joint: No joint effusion. No chondral defect.

Labrum: Grossly intact, but evaluation is limited by lack of
intraarticular fluid.

Bones: Diffusely decreased T1 marrow signal. No focal lesion. No
acute fracture or dislocation.

Other: None.
IMPRESSION: 1. Moderate supraspinatus tendinosis with articular surface fraying
but no discrete tear.
2. Moderate infraspinatus tendinosis with small interstitial tear of
the myotendinous junction.
3. Mild subscapularis and intra-articular biceps tendinosis.
4. Mild acromioclavicular osteoarthritis.
5. Mild subacromial/subdeltoid bursitis.
6. Diffusely decreased marrow signal. Although this can be caused by
marrow infiltrative processes, the most common causes include
anemia, smoking, obesity, or advancing age.

## 2021-06-21 IMAGING — US US ABDOMINAL AORTA SCREENING AAA
1 series · 14 of 18 positions shown · non-contrast
Comparison: CT abdomen/pelvis 09/09/2010, renal ultrasound
11/14/2004

CLINICAL DATA: Former smoker

EXAM:
US ABDOMINAL AORTA MEDICARE SCREENING
TECHNIQUE: Ultrasound examination of the abdominal aorta was performed as a
screening evaluation for abdominal aortic aneurysm.

[Series 1: us abdominal aorta screening aaa · 0.30mm/px · 14 of 18 slices shown]
[im 1/18]
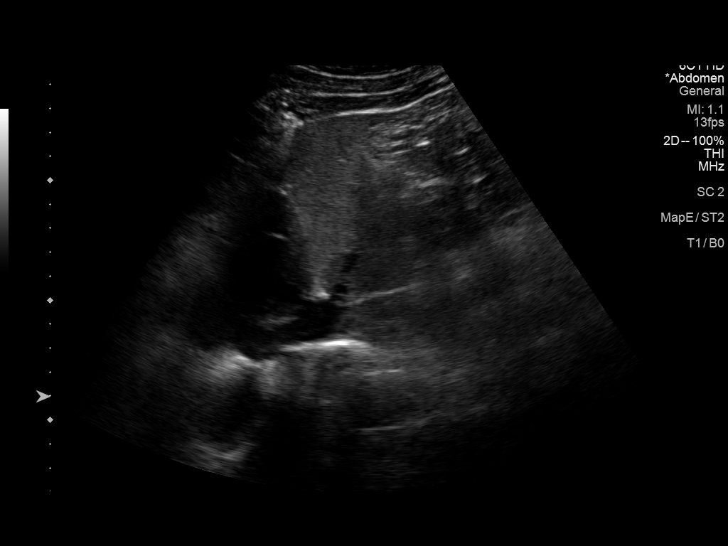
[im 2/18]
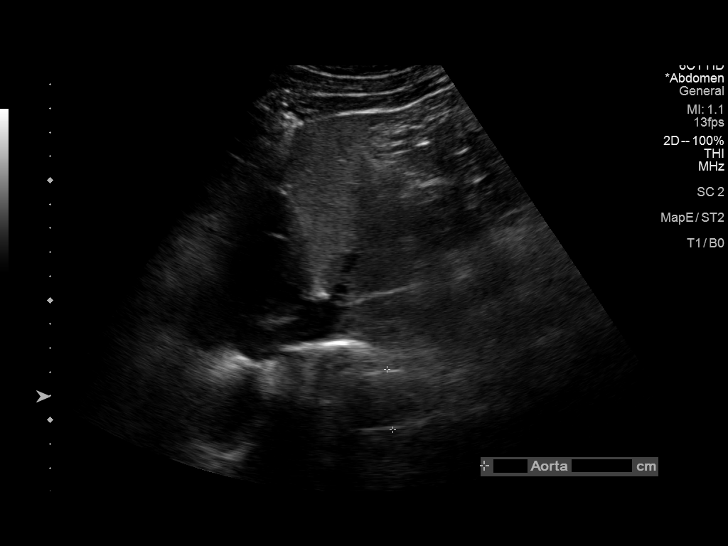
[im 4/18]
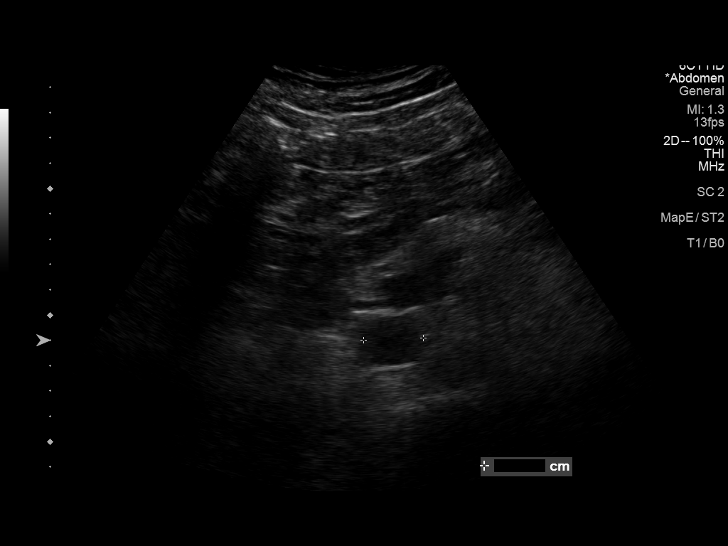
[im 5/18]
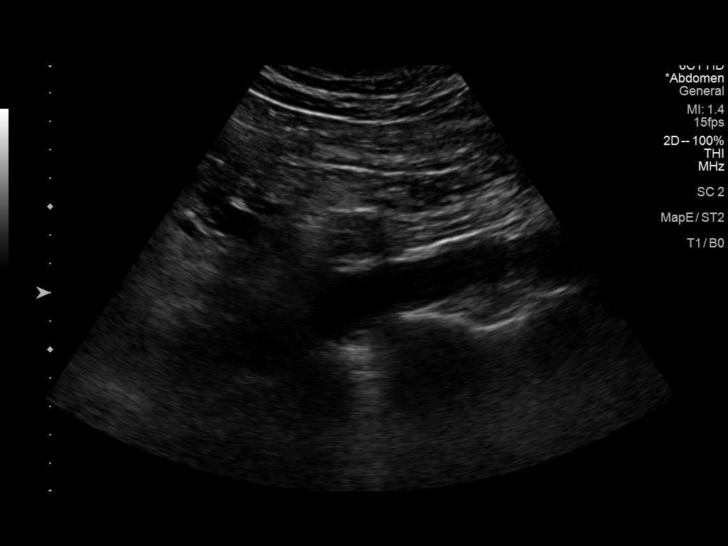
[im 6/18]
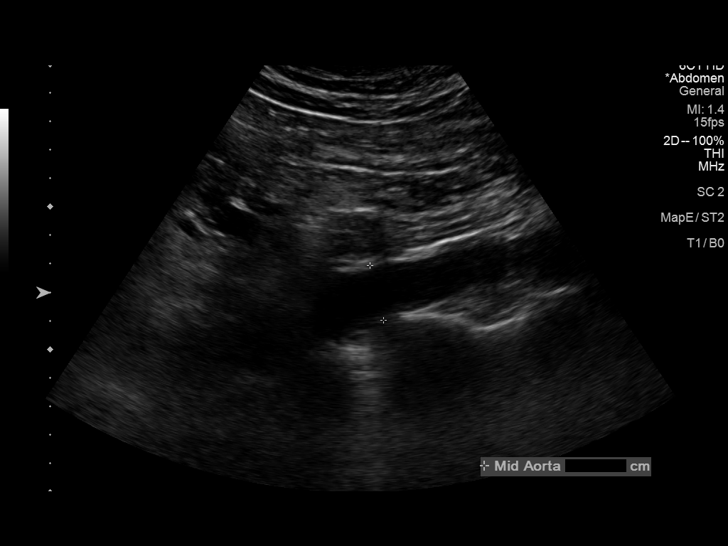
[im 8/18]
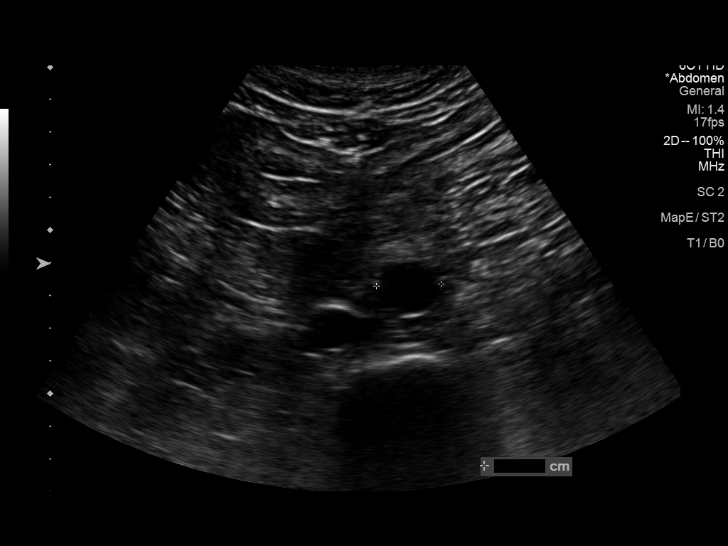
[im 9/18]
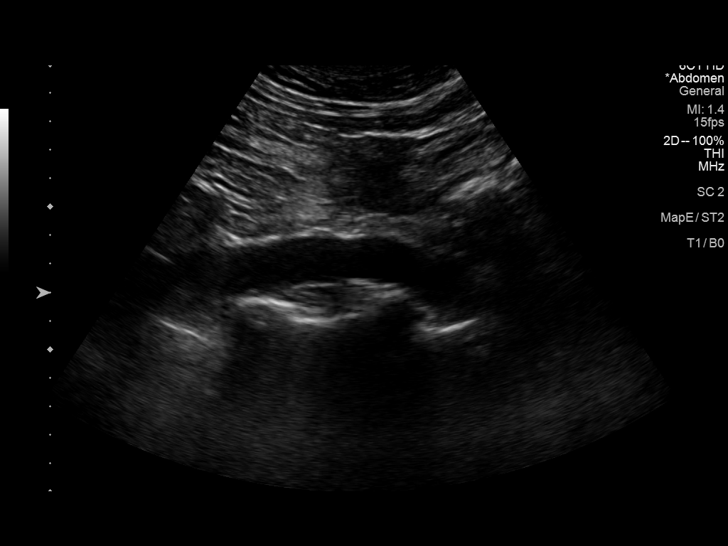
[im 10/18]
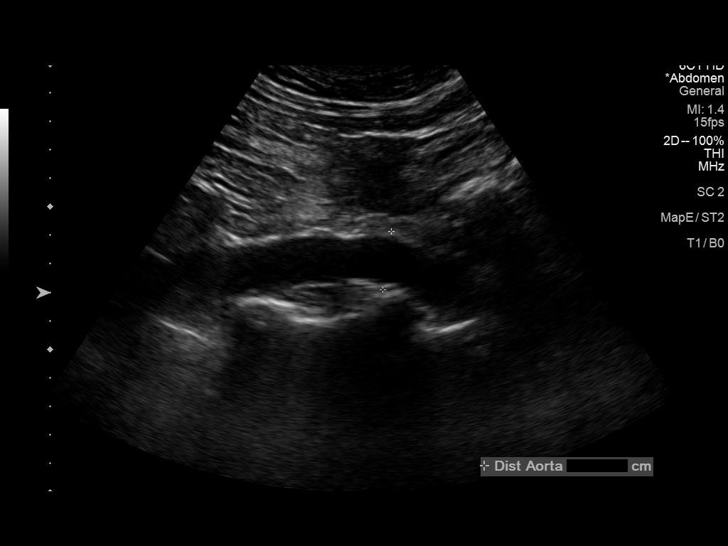
[im 11/18]
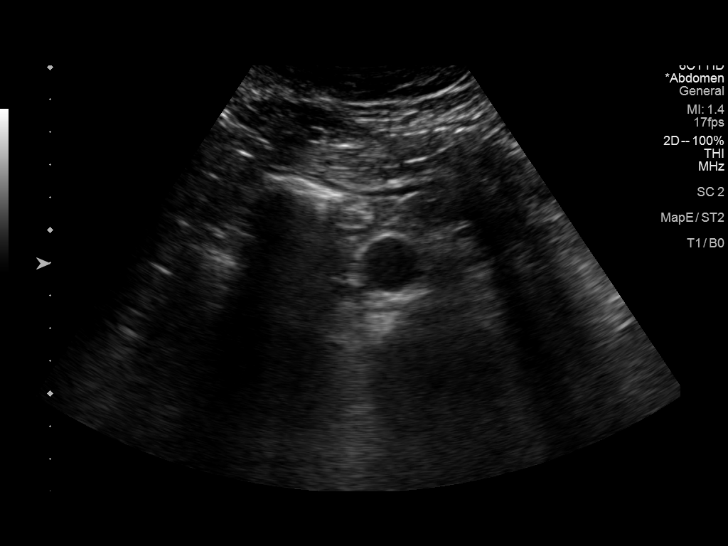
[im 13/18]
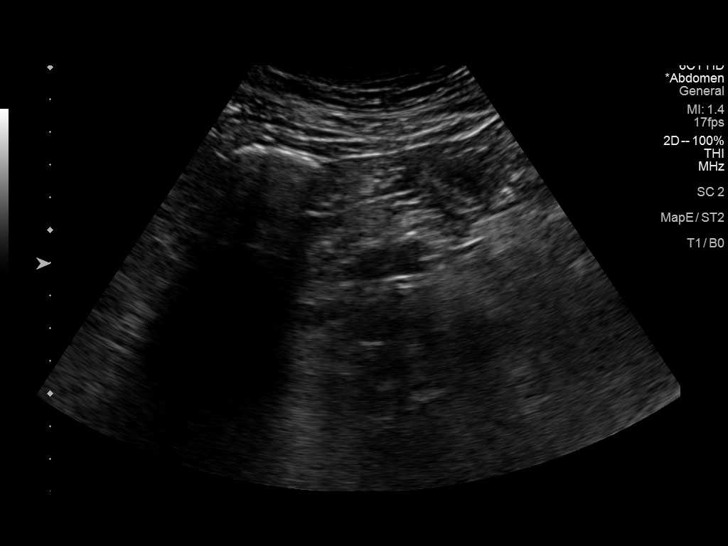
[im 14/18]
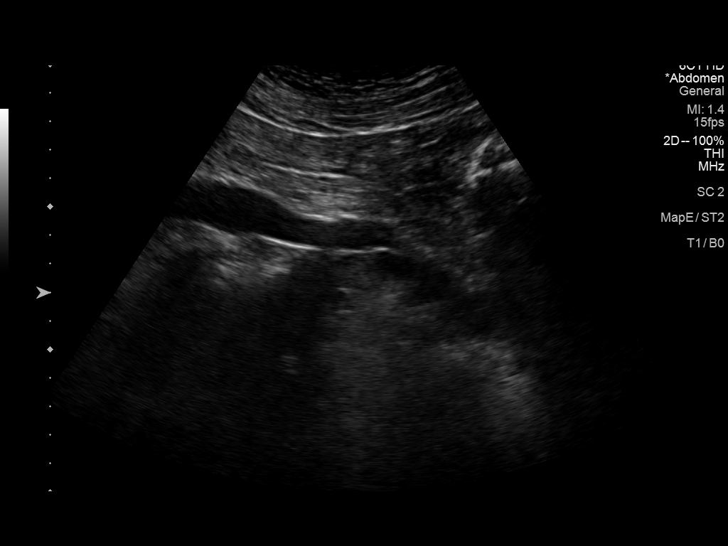
[im 15/18]
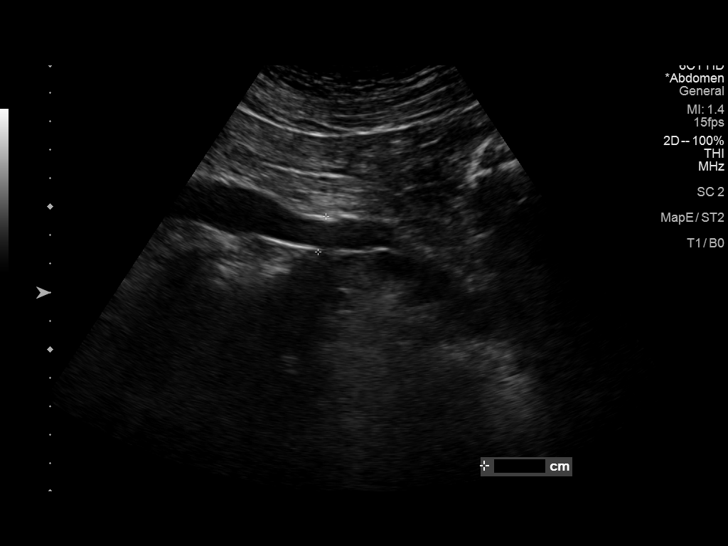
[im 17/18]
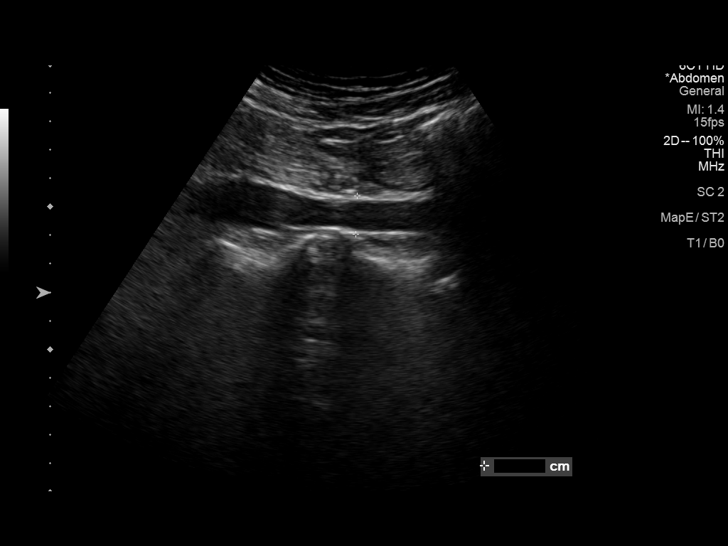
[im 18/18]
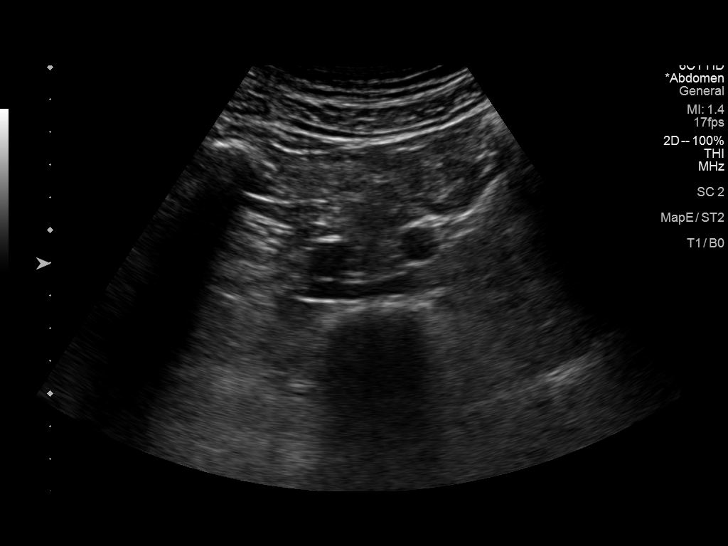

[14 of 18 positions shown; findings below may reference images not displayed]

FINDINGS: Abdominal aortic measurements as follows:

Proximal:  2.5 x 2.4 cm

Mid:  2.0 x 2.0 cm

Distal:  2.1 x 1.8 cm
IMPRESSION: No abdominal aortic aneurysm. Maximal aortic diameter 2.5 x 2.4 cm
proximally.

## 2021-11-20 ENCOUNTER — Other Ambulatory Visit: Payer: Self-pay

## 2021-11-20 ENCOUNTER — Encounter (HOSPITAL_BASED_OUTPATIENT_CLINIC_OR_DEPARTMENT_OTHER): Payer: Self-pay | Admitting: Otolaryngology

## 2021-11-21 ENCOUNTER — Other Ambulatory Visit: Payer: Self-pay | Admitting: Otolaryngology

## 2021-11-27 ENCOUNTER — Ambulatory Visit (HOSPITAL_BASED_OUTPATIENT_CLINIC_OR_DEPARTMENT_OTHER): Payer: Medicare HMO | Admitting: Certified Registered"

## 2021-11-27 ENCOUNTER — Other Ambulatory Visit: Payer: Self-pay

## 2021-11-27 ENCOUNTER — Encounter (HOSPITAL_BASED_OUTPATIENT_CLINIC_OR_DEPARTMENT_OTHER)
Admission: RE | Disposition: A | Discharge status: Home / Self Care | Payer: Self-pay | Source: Home / Self Care | Attending: Otolaryngology

## 2021-11-27 ENCOUNTER — Encounter (HOSPITAL_BASED_OUTPATIENT_CLINIC_OR_DEPARTMENT_OTHER): Payer: Self-pay | Admitting: Otolaryngology

## 2021-11-27 ENCOUNTER — Ambulatory Visit (HOSPITAL_BASED_OUTPATIENT_CLINIC_OR_DEPARTMENT_OTHER)
Admission: RE | Admit: 2021-11-27 | Discharge: 2021-11-27 | Disposition: A | Discharge status: Home / Self Care | Payer: Medicare HMO | Attending: Otolaryngology | Admitting: Otolaryngology

## 2021-11-27 DIAGNOSIS — D649 Anemia, unspecified: Secondary | ICD-10-CM | POA: Diagnosis not present

## 2021-11-27 DIAGNOSIS — G4733 Obstructive sleep apnea (adult) (pediatric): Secondary | ICD-10-CM | POA: Diagnosis not present

## 2021-11-27 DIAGNOSIS — J343 Hypertrophy of nasal turbinates: Secondary | ICD-10-CM | POA: Insufficient documentation

## 2021-11-27 DIAGNOSIS — K219 Gastro-esophageal reflux disease without esophagitis: Secondary | ICD-10-CM | POA: Insufficient documentation

## 2021-11-27 DIAGNOSIS — J342 Deviated nasal septum: Secondary | ICD-10-CM | POA: Insufficient documentation

## 2021-11-27 DIAGNOSIS — Z87891 Personal history of nicotine dependence: Secondary | ICD-10-CM | POA: Insufficient documentation

## 2021-11-27 DIAGNOSIS — D759 Disease of blood and blood-forming organs, unspecified: Secondary | ICD-10-CM | POA: Diagnosis not present

## 2021-11-27 DIAGNOSIS — Z01818 Encounter for other preprocedural examination: Secondary | ICD-10-CM

## 2021-11-27 DIAGNOSIS — J3489 Other specified disorders of nose and nasal sinuses: Secondary | ICD-10-CM

## 2021-11-27 HISTORY — PX: DRUG INDUCED ENDOSCOPY: SHX6808

## 2021-11-27 HISTORY — DX: Personal history of nicotine dependence: Z87.891

## 2021-11-27 HISTORY — DX: Hereditary hemochromatosis: E83.110

## 2021-11-27 HISTORY — PX: NASAL SEPTOPLASTY W/ TURBINOPLASTY: SHX2070

## 2021-11-27 HISTORY — DX: Sleep apnea, unspecified: G47.30

## 2021-11-27 HISTORY — DX: Other myelodysplastic syndromes: D46.Z

## 2021-11-27 HISTORY — DX: Dyspnea, unspecified: R06.00

## 2021-11-27 HISTORY — DX: Benign prostatic hyperplasia without lower urinary tract symptoms: N40.0

## 2021-11-27 SURGERY — SEPTOPLASTY, NOSE, WITH NASAL TURBINATE REDUCTION
Anesthesia: General | Site: Nose

## 2021-11-27 MED ORDER — PROPOFOL 10 MG/ML IV BOLUS
INTRAVENOUS | Status: DC | PRN
  Administered 2021-11-27: 200 mg via INTRAVENOUS

## 2021-11-27 MED ORDER — CEPHALEXIN 500 MG PO CAPS: 500.0000 mg | ORAL_CAPSULE | Freq: Three times a day (TID) | ORAL | 0 refills | Status: AC

## 2021-11-27 MED ORDER — LIDOCAINE 2% (20 MG/ML) 5 ML SYRINGE
INTRAMUSCULAR | Status: AC
  Filled 2021-11-27: qty 15

## 2021-11-27 MED ORDER — ONDANSETRON HCL 4 MG/2ML IJ SOLN
INTRAMUSCULAR | Status: DC | PRN
  Administered 2021-11-27: 4 mg via INTRAVENOUS

## 2021-11-27 MED ORDER — ONDANSETRON HCL 4 MG/2ML IJ SOLN
INTRAMUSCULAR | Status: AC
  Filled 2021-11-27: qty 10

## 2021-11-27 MED ORDER — OXYCODONE HCL 5 MG PO TABS
5.0000 mg | ORAL_TABLET | Freq: Once | ORAL | Status: AC | PRN
  Administered 2021-11-27: 5 mg via ORAL

## 2021-11-27 MED ORDER — FENTANYL CITRATE (PF) 100 MCG/2ML IJ SOLN
INTRAMUSCULAR | Status: DC | PRN
  Administered 2021-11-27: 50 ug via INTRAVENOUS

## 2021-11-27 MED ORDER — ROCURONIUM BROMIDE 10 MG/ML (PF) SYRINGE
PREFILLED_SYRINGE | INTRAVENOUS | Status: AC
  Filled 2021-11-27: qty 10

## 2021-11-27 MED ORDER — SUGAMMADEX SODIUM 200 MG/2ML IV SOLN
INTRAVENOUS | Status: DC | PRN
  Administered 2021-11-27: 200 mg via INTRAVENOUS

## 2021-11-27 MED ORDER — ROCURONIUM BROMIDE 100 MG/10ML IV SOLN
INTRAVENOUS | Status: DC | PRN
  Administered 2021-11-27: 70 mg via INTRAVENOUS

## 2021-11-27 MED ORDER — OXYCODONE HCL 5 MG PO TABS
ORAL_TABLET | ORAL | Status: AC
  Filled 2021-11-27: qty 1

## 2021-11-27 MED ORDER — AMISULPRIDE (ANTIEMETIC) 5 MG/2ML IV SOLN: 10.0000 mg | Freq: Once | INTRAVENOUS | Status: DC | PRN

## 2021-11-27 MED ORDER — ACETAMINOPHEN 500 MG PO TABS
1000.0000 mg | ORAL_TABLET | Freq: Once | ORAL | Status: AC
  Administered 2021-11-27: 1000 mg via ORAL

## 2021-11-27 MED ORDER — CEFAZOLIN SODIUM-DEXTROSE 2-3 GM-%(50ML) IV SOLR
INTRAVENOUS | Status: DC | PRN
  Administered 2021-11-27: 2 g via INTRAVENOUS

## 2021-11-27 MED ORDER — DEXAMETHASONE SODIUM PHOSPHATE 4 MG/ML IJ SOLN
INTRAMUSCULAR | Status: DC | PRN
  Administered 2021-11-27: 4 mg via INTRAVENOUS

## 2021-11-27 MED ORDER — ACETAMINOPHEN 500 MG PO TABS
ORAL_TABLET | ORAL | Status: AC
  Filled 2021-11-27: qty 2

## 2021-11-27 MED ORDER — PROPOFOL 500 MG/50ML IV EMUL
INTRAVENOUS | Status: AC
  Filled 2021-11-27: qty 50

## 2021-11-27 MED ORDER — PHENYLEPHRINE HCL (PRESSORS) 10 MG/ML IV SOLN
INTRAVENOUS | Status: DC | PRN
  Administered 2021-11-27: 80 ug via INTRAVENOUS

## 2021-11-27 MED ORDER — MUPIROCIN 2 % EX OINT
TOPICAL_OINTMENT | CUTANEOUS | Status: DC | PRN
  Administered 2021-11-27: 1 via NASAL

## 2021-11-27 MED ORDER — OXYMETAZOLINE HCL 0.05 % NA SOLN
NASAL | Status: DC | PRN
  Administered 2021-11-27: 1 via TOPICAL

## 2021-11-27 MED ORDER — LACTATED RINGERS IV SOLN: INTRAVENOUS | Status: DC

## 2021-11-27 MED ORDER — FENTANYL CITRATE (PF) 100 MCG/2ML IJ SOLN: 25.0000 ug | INTRAMUSCULAR | Status: DC | PRN

## 2021-11-27 MED ORDER — FENTANYL CITRATE (PF) 100 MCG/2ML IJ SOLN
INTRAMUSCULAR | Status: AC
  Filled 2021-11-27: qty 2

## 2021-11-27 MED ORDER — LIDOCAINE-EPINEPHRINE 1 %-1:100000 IJ SOLN
INTRAMUSCULAR | Status: DC | PRN
  Administered 2021-11-27: 5 mL

## 2021-11-27 MED ORDER — OXYCODONE HCL 5 MG/5ML PO SOLN: 5.0000 mg | Freq: Once | ORAL | Status: AC | PRN

## 2021-11-27 MED ORDER — PROPOFOL 500 MG/50ML IV EMUL
INTRAVENOUS | Status: DC | PRN
  Administered 2021-11-27: 25 ug/kg/min via INTRAVENOUS

## 2021-11-27 MED ORDER — DEXAMETHASONE SODIUM PHOSPHATE 10 MG/ML IJ SOLN
INTRAMUSCULAR | Status: AC
  Filled 2021-11-27: qty 1

## 2021-11-27 MED ORDER — PROPOFOL 500 MG/50ML IV EMUL
INTRAVENOUS | Status: AC
  Filled 2021-11-27: qty 100

## 2021-11-27 SURGICAL SUPPLY — 37 items
ATTRACTOMAT 16X20 MAGNETIC DRP (DRAPES) IMPLANT
BLADE SURG 15 STRL LF DISP TIS (BLADE) IMPLANT
BLADE SURG 15 STRL SS (BLADE)
CANISTER SUCT 1200ML W/VALVE (MISCELLANEOUS) ×3 IMPLANT
COAGULATOR SUCT 8FR VV (MISCELLANEOUS) IMPLANT
DRSG NASOPORE 8CM (GAUZE/BANDAGES/DRESSINGS) IMPLANT
DRSG TELFA 3X8 NADH STRL (GAUZE/BANDAGES/DRESSINGS) IMPLANT
ELECT REM PT RETURN 9FT ADLT (ELECTROSURGICAL)
ELECTRODE REM PT RTRN 9FT ADLT (ELECTROSURGICAL) IMPLANT
GLOVE BIOGEL M 7.0 STRL (GLOVE) ×6 IMPLANT
GOWN STRL REUS W/ TWL LRG LVL3 (GOWN DISPOSABLE) ×4 IMPLANT
GOWN STRL REUS W/TWL LRG LVL3 (GOWN DISPOSABLE) ×4
IV SET EXT 30 76VOL 4 MALE LL (IV SETS) ×2 IMPLANT
KIT CLEAN ENDO (MISCELLANEOUS) ×3 IMPLANT
NDL HYPO 27GX1-1/4 (NEEDLE) ×3 IMPLANT
NEEDLE HYPO 27GX1-1/4 (NEEDLE) ×2 IMPLANT
NS IRRIG 1000ML POUR BTL (IV SOLUTION) IMPLANT
PACK BASIN DAY SURGERY FS (CUSTOM PROCEDURE TRAY) ×2 IMPLANT
PACK ENT DAY SURGERY (CUSTOM PROCEDURE TRAY) ×2 IMPLANT
PATTIES SURGICAL .5 X3 (DISPOSABLE) IMPLANT
SHEET MEDIUM DRAPE 40X70 STRL (DRAPES) ×3 IMPLANT
SLEEVE SCD COMPRESS KNEE MED (STOCKING) IMPLANT
SOL ANTI FOG 6CC (MISCELLANEOUS) ×2 IMPLANT
SOLUTION ANTI FOG 6CC (MISCELLANEOUS) ×2
SPIKE FLUID TRANSFER (MISCELLANEOUS) IMPLANT
SPLINT NASAL AIRWAY SILICONE (MISCELLANEOUS) ×2 IMPLANT
SPONGE GAUZE 2X2 8PLY STRL LF (GAUZE/BANDAGES/DRESSINGS) ×2 IMPLANT
SPONGE NEURO XRAY DETECT 1X3 (DISPOSABLE) ×2 IMPLANT
SPONGE SURGIFOAM ABS GEL 12-7 (HEMOSTASIS) IMPLANT
SUT ETHILON 3 0 PS 1 (SUTURE) ×3 IMPLANT
SUT PLAIN 4 0 ~~LOC~~ 1 (SUTURE) ×2 IMPLANT
SYR CONTROL 10ML LL (SYRINGE) IMPLANT
TOWEL GREEN STERILE FF (TOWEL DISPOSABLE) ×3 IMPLANT
TUBE CONNECTING 20X1/4 (TUBING) IMPLANT
TUBE SALEM SUMP 12R W/ARV (TUBING) IMPLANT
TUBE SALEM SUMP 16 FR W/ARV (TUBING) IMPLANT
YANKAUER SUCT BULB TIP NO VENT (SUCTIONS) ×2 IMPLANT

## 2021-11-27 NOTE — H&P (Signed)
Shawn Bentley is an 76 y.o. male.   Chief Complaint: Nasal airway obstruction and obstructive sleep apnea HPI: Patient with a history of progressive nasal airway obstruction, physical exam shows severely deviated nasal septum and turbinate hypertrophy.  Patient has a history of obstructive sleep apnea is unable to tolerate CPAP.  Past Medical History:  Diagnosis Date   Anemia    Arthritis    BPH (benign prostatic hyperplasia)    Dyspnea    with exertion   Former smoker    GERD (gastroesophageal reflux disease)    Hereditary hemochromatosis ([place])    MDS (myelodysplastic syndrome), low grade (HCC)    Sleep apnea    does not use CPAP    Past Surgical History:  Procedure Laterality Date   APPENDECTOMY     HERNIA REPAIR Bilateral    SHOULDER ARTHROSCOPY Right     Family History  Problem Relation Age of Onset   Heart attack Father    Heart disease Father    Social History:  reports that he quit smoking about 49 years ago. His smoking use included cigarettes. He has a 6.00 pack-year smoking history. He quit smokeless tobacco use about 49 years ago. He reports current alcohol use. He reports that he does not use drugs.  Allergies:  Allergies  Allergen Reactions   Pseudoephedrine-Acetaminophen Other (See Comments)    jittery   Sinus Congestion-Pain Daytime  [Phenylephrine-Acetaminophen] Anxiety    jittery   Sinus [Pseudoephedrine-Acetaminophen] Other (See Comments)    jittery   Meclizine Other (See Comments)    cloudy    Medications Prior to Admission  Medication Sig Dispense Refill   cyclobenzaprine (FLEXERIL) 10 MG tablet Take 10 mg by mouth 3 (three) times daily as needed for muscle spasms.     dutasteride (AVODART) 0.5 MG capsule Take 0.5 mg by mouth at bedtime.     famotidine (PEPCID) 40 MG tablet Take 40 mg by mouth 2 (two) times daily.     gabapentin (NEURONTIN) 300 MG capsule Take 300 mg by mouth 2 (two) times daily.     glucosamine-chondroitin 500-400 MG tablet  Take 1 tablet by mouth 3 (three) times daily.     Mag Aspart-Potassium Aspart (POTASSIUM & MAGNESIUM ASPARTAT) 250-250 MG CAPS Take by mouth.     naproxen sodium (ALEVE) 220 MG tablet Take 220 mg by mouth.     pantoprazole (PROTONIX) 40 MG tablet Take 40 mg by mouth 2 (two) times daily.     tamsulosin (FLOMAX) 0.4 MG CAPS capsule Take 0.4 mg by mouth at bedtime.     Turmeric 500 MG TABS Take by mouth.     albuterol (VENTOLIN HFA) 108 (90 Base) MCG/ACT inhaler Inhale into the lungs every 6 (six) hours as needed for wheezing or shortness of breath.      No results found for this or any previous visit (from the past 48 hour(s)). No results found.  Review of Systems  HENT:  Positive for congestion.   Respiratory:  Positive for apnea.     Blood pressure 139/68, pulse (!) 50, temperature 98 F (36.7 C), temperature source Temporal, resp. rate 16, height '5\' 7"'$  (1.702 m), weight 77.7 kg, SpO2 100 %. Physical Exam Constitutional:      Appearance: Normal appearance.  HENT:     Nose:     Comments: Severe nasal septal deviation and turbinate hypertrophy Cardiovascular:     Rate and Rhythm: Normal rate.  Pulmonary:     Effort: Pulmonary effort is normal.  Musculoskeletal:     Cervical back: Normal range of motion.  Neurological:     Mental Status: He is alert.      Assessment/Plan Patient admitted for outpatient surgery under general anesthesia: Drug-induced sleep endoscopy, nasal septoplasty and inferior turbinate reduction.  Jerrell Belfast, MD 11/27/2021, 9:14 AM

## 2021-11-27 NOTE — Op Note (Signed)
Operative Note: SEPTOPLASTY AND INFERIOR TURBINATE REDUCTION   DISE  Patient: Shawn Bentley  Medical record number: [id number]  Date:11/27/2021  Pre-operative Indications: 1. Deviated nasal septum with nasal airway obstruction     2.  Bilateral inferior turbinate hypertrophy     3.  Obstructive sleep apnea  Postoperative Indications: Same  Surgical Procedure: 1.  Nasal Septoplasty    2.  Bilateral Inferior Turbinate Reduction    3.  Drug-induced sleep endoscopy  Anesthesia: GET  BMI: 26.8 kg/m  Surgeon: Delsa Bern, M.D.  Complications: None  EBL: 50 cc  Findings: Severely deviated nasal septum with airway obstruction and bilateral inferior turbinate hypertrophy.  Anterior to posterior airway obstruction consistent with history of obstructive sleep apnea.   Brief History: The patient is a 76 y.o. male with a history of progressive nasal airway obstruction. The patient has been on medical therapy to reduce nasal mucosal edema including saline nasal spray and topical nasal steroids. Despite appropriate medical therapy the patient continues to have ongoing symptoms. Given the patient's history and findings, the above surgical procedures were recommended, risks and benefits were discussed in detail with the patient may understand and agree with our plan for surgery which is scheduled at [facility] under general anesthesia as an outpatient.  Surgical Procedure:  The patient is brought to the operating room on 11/27/2021 and placed in supine position on the operating table.  Intravenous sedated anesthesia was established without difficulty using the standard drug-induced sleep endoscopy protocol. When the patient was adequately anesthetized, surgical timeout was performed and correct identification of the patient and the surgical procedure.    A propofol infusion was administered and the patient was monitored carefully to achieve a level of sedation appropriate for DISE.  The patient  did not respond to verbal commands but still had spontaneous respiration, sleep disordered breathing and associated desaturations were observed.  With the patient under adequate sedated anesthesia the flexible nasal laryngoscope was passed without difficulty.  The patient's nasal cavity showed severe nasal septal deviation with near complete left-sided obstruction.  The endoscope was then passed to visualize the velopharynx, oropharynx, tongue base and epiglottis to assess areas of obstruction.  Patient's airway showed anterior to posterior obstruction from the palate and soft tissue.   There was no evidence of complete concentric palatal obstruction and the patient appeared to be a candidate anatomically for hypoglossal nerve stimulation therapy.  General endotracheal anesthesia was established without difficulty. When the patient was adequately anesthetized, surgical timeout was performed and correct identification of the patient and the surgical procedure. The patient's nose was then injected with 5 cc of 1% lidocaine 1:100,000 dilution epinephrine which was injected in a submucosal fashion. The patient's nose was then packed with Afrin-soaked cottonoid pledgets were left in place for approximately 10 minutes lateral vasoconstriction and hemostasis.  With the patient prepped draped and prepared for surgery, nasal septoplasty was begun.  A left anterior hemitransfixion incision was created and a mucoperichondrial flap was elevated from anterior to posterior on the left-hand side. The anterior cartilaginous septum was crossed at the midline and a mucoperichondrial flap was elevated on the patient's right.  Swivel knife was then used to resect the anterior and mid cartilaginous portion of the nasal septum.  Resected cartilage was morcellized and returned to the mucoperichondrial pocket at the occlusion of the surgical procedure.  Dissection was then carried out from anterior to posterior removing deviated bone  and cartilage including a large septal spur the overlying mucosa  was preserved.  With the septum brought to good midline position, the morselized cartilage was returned to the mucoperichondrial pocket and the soft tissue/mucosal flaps were reapproximated with interrupted 4-0 gut suture on a Keith needle in a horizontal mattressing fashion.  Anterior hemitransfixion incision was closed with the same stitch.  Bilateral Doyle nasal septal splints were then placed after the application of Bactroban ointment and sutured in position with a 3-0 Ethilon suture.  Attention was then turned to the inferior turbinates, bilateral inferior turbinate intramural cautery was performed with cautery setting at 104 W.  2 submucosal passes were made in each inferior turbinate.  After completing cautery, anterior vertical incisions were created and overlying soft tissue was elevated, a small amount of turbinate bone was resected.  The turbinates were then outfractured to create a more patent nasal passageway.  Surgical sponge count was correct. An oral gastric tube was passed and the stomach contents were aspirated. Patient was awakened from anesthetic and transferred from the operating room to the recovery room in stable condition. There were no complications and blood loss was 50cc.   Delsa Bern, M.D. [facility] 11/27/2021

## 2021-11-27 NOTE — Anesthesia Preprocedure Evaluation (Addendum)
Anesthesia Evaluation  Patient identified by MRN, date of birth, ID band Patient awake    Reviewed: Allergy & Precautions, NPO status , Patient's Chart, lab work & pertinent test results  Airway Mallampati: II  TM Distance: >3 FB Neck ROM: Full    Dental  (+) Dental Advisory Given   Pulmonary sleep apnea , former smoker,    breath sounds clear to auscultation       Cardiovascular negative cardio ROS   Rhythm:Regular Rate:Normal     Neuro/Psych negative neurological ROS     GI/Hepatic Neg liver ROS, GERD  ,  Endo/Other  negative endocrine ROS  Renal/GU negative Renal ROS     Musculoskeletal   Abdominal   Peds  Hematology  (+) Blood dyscrasia, anemia ,   Anesthesia Other Findings   Reproductive/Obstetrics                              Anesthesia Physical Anesthesia Plan  ASA: 2  Anesthesia Plan: General   Post-op Pain Management: Toradol IV (intra-op)* and Tylenol PO (pre-op)*   Induction: Intravenous  PONV Risk Score and Plan: 2 and Dexamethasone, Ondansetron and Treatment may vary due to age or medical condition  Airway Management Planned: Oral ETT  Additional Equipment:   Intra-op Plan:   Post-operative Plan: Extubation in OR  Informed Consent: I have reviewed the patients History and Physical, chart, labs and discussed the procedure including the risks, benefits and alternatives for the proposed anesthesia with the patient or authorized representative who has indicated his/her understanding and acceptance.     Dental advisory given  Plan Discussed with: CRNA  Anesthesia Plan Comments:        Anesthesia Quick Evaluation

## 2021-11-27 NOTE — Anesthesia Procedure Notes (Signed)
Procedure Name: Intubation Date/Time: 11/27/2021 10:02 AM  Performed by: Jamani Eley, Ernesta Amble, CRNAPre-anesthesia Checklist: Patient identified, Emergency Drugs available, Suction available and Patient being monitored Patient Re-evaluated:Patient Re-evaluated prior to induction Oxygen Delivery Method: Circle system utilized Preoxygenation: Pre-oxygenation with 100% oxygen Induction Type: IV induction Ventilation: Mask ventilation without difficulty Laryngoscope Size: Mac and 3 Grade View: Grade II Tube type: Oral Tube size: 7.0 mm Number of attempts: 1 Airway Equipment and Method: Stylet and Oral airway Placement Confirmation: ETT inserted through vocal cords under direct vision, positive ETCO2 and breath sounds checked- equal and bilateral Secured at: 21 cm Tube secured with: Tape Dental Injury: Teeth and Oropharynx as per pre-operative assessment

## 2021-11-27 NOTE — Transfer of Care (Signed)
Immediate Anesthesia Transfer of Care Note  Patient: Shawn Bentley  Procedure(s) Performed: NASAL SEPTOPLASTY WITH TURBINATE REDUCTION (Bilateral: Nose) DRUG INDUCED SLEEP ENDOSCOPY (Nose)  Patient Location: PACU  Anesthesia Type:General  Level of Consciousness: drowsy and patient cooperative  Airway & Oxygen Therapy: Patient Spontanous Breathing and Patient connected to face mask oxygen  Post-op Assessment: Report given to RN and Post -op Vital signs reviewed and stable  Post vital signs: Reviewed and stable  Last Vitals:  Vitals Value Taken Time  BP    Temp    Pulse 57 11/27/21 1050  Resp 12 11/27/21 1050  SpO2 99 % 11/27/21 1050  Vitals shown include unvalidated device data.  Last Pain:  Vitals:   11/27/21 0822  TempSrc: Temporal  PainSc: 0-No pain         Complications: No notable events documented.

## 2021-11-27 NOTE — Anesthesia Postprocedure Evaluation (Signed)
Anesthesia Post Note  Patient: Shawn Bentley  Procedure(s) Performed: NASAL SEPTOPLASTY WITH TURBINATE REDUCTION (Bilateral: Nose) DRUG INDUCED SLEEP ENDOSCOPY (Nose)     Patient location during evaluation: PACU Anesthesia Type: General Level of consciousness: awake and alert Pain management: pain level controlled Vital Signs Assessment: post-procedure vital signs reviewed and stable Respiratory status: spontaneous breathing, nonlabored ventilation, respiratory function stable and patient connected to nasal cannula oxygen Cardiovascular status: blood pressure returned to baseline and stable Postop Assessment: no apparent nausea or vomiting Anesthetic complications: no   No notable events documented.  Last Vitals:  Vitals:   11/27/21 1130 11/27/21 1157  BP: (!) 150/76 (!) 137/93  Pulse: (!) 53 (!) 55  Resp: 13 16  Temp:  (!) 36.2 C  SpO2: 99% 99%    Last Pain:  Vitals:   11/27/21 1157  TempSrc:   PainSc: [place]

## 2021-11-27 NOTE — Discharge Instructions (Signed)
No Tylenol before 3:00pm if needed.  Post Anesthesia Home Care Instructions  Activity: Get plenty of rest for the remainder of the day. A responsible individual must stay with you for 24 hours following the procedure.  For the next 24 hours, DO NOT: -Drive a car -Paediatric nurse -Drink alcoholic beverages -Take any medication unless instructed by your physician -Make any legal decisions or sign important papers.  Meals: Start with liquid foods such as gelatin or soup. Progress to regular foods as tolerated. Avoid greasy, spicy, heavy foods. If nausea and/or vomiting occur, drink only clear liquids until the nausea and/or vomiting subsides. Call your physician if vomiting continues.  Special Instructions/Symptoms: Your throat may feel dry or sore from the anesthesia or the breathing tube placed in your throat during surgery. If this causes discomfort, gargle with warm salt water. The discomfort should disappear within 24 hours.  If you had a scopolamine patch placed behind your ear for the management of post- operative nausea and/or vomiting:  1. The medication in the patch is effective for 72 hours, after which it should be removed.  Wrap patch in a tissue and discard in the trash. Wash hands thoroughly with soap and water. 2. You may remove the patch earlier than 72 hours if you experience unpleasant side effects which may include dry mouth, dizziness or visual disturbances. 3. Avoid touching the patch. Wash your hands with soap and water after contact with the patch.

## 2021-11-28 ENCOUNTER — Encounter (HOSPITAL_BASED_OUTPATIENT_CLINIC_OR_DEPARTMENT_OTHER): Payer: Self-pay | Admitting: Otolaryngology

## 2022-01-13 NOTE — Progress Notes (Signed)
Reviewed chart with Dr. Tobias Alexander, okay to proceed with surgery on 01/21/22 at [facility].

## 2022-01-14 ENCOUNTER — Other Ambulatory Visit: Payer: Self-pay

## 2022-01-14 ENCOUNTER — Encounter (HOSPITAL_BASED_OUTPATIENT_CLINIC_OR_DEPARTMENT_OTHER): Payer: Self-pay | Admitting: Otolaryngology

## 2022-01-15 ENCOUNTER — Other Ambulatory Visit: Payer: Self-pay | Admitting: Otolaryngology

## 2022-01-20 ENCOUNTER — Other Ambulatory Visit: Payer: Self-pay | Admitting: Otolaryngology

## 2022-01-21 ENCOUNTER — Encounter (HOSPITAL_BASED_OUTPATIENT_CLINIC_OR_DEPARTMENT_OTHER): Payer: Self-pay | Admitting: Otolaryngology

## 2022-01-21 ENCOUNTER — Ambulatory Visit (HOSPITAL_BASED_OUTPATIENT_CLINIC_OR_DEPARTMENT_OTHER): Payer: Medicare HMO | Admitting: Anesthesiology

## 2022-01-21 ENCOUNTER — Ambulatory Visit (HOSPITAL_COMMUNITY): Payer: Medicare HMO

## 2022-01-21 ENCOUNTER — Ambulatory Visit (HOSPITAL_BASED_OUTPATIENT_CLINIC_OR_DEPARTMENT_OTHER)
Admission: RE | Admit: 2022-01-21 | Discharge: 2022-01-21 | Disposition: A | Discharge status: Home / Self Care | Payer: Medicare HMO | Source: Ambulatory Visit | Attending: Otolaryngology | Admitting: Otolaryngology

## 2022-01-21 ENCOUNTER — Other Ambulatory Visit: Payer: Self-pay

## 2022-01-21 ENCOUNTER — Encounter (HOSPITAL_BASED_OUTPATIENT_CLINIC_OR_DEPARTMENT_OTHER)
Admission: RE | Disposition: A | Discharge status: Home / Self Care | Payer: Self-pay | Source: Ambulatory Visit | Attending: Otolaryngology

## 2022-01-21 DIAGNOSIS — D469 Myelodysplastic syndrome, unspecified: Secondary | ICD-10-CM | POA: Diagnosis not present

## 2022-01-21 DIAGNOSIS — M1990 Unspecified osteoarthritis, unspecified site: Secondary | ICD-10-CM

## 2022-01-21 DIAGNOSIS — Z79899 Other long term (current) drug therapy: Secondary | ICD-10-CM | POA: Insufficient documentation

## 2022-01-21 DIAGNOSIS — G4733 Obstructive sleep apnea (adult) (pediatric): Secondary | ICD-10-CM | POA: Insufficient documentation

## 2022-01-21 DIAGNOSIS — K219 Gastro-esophageal reflux disease without esophagitis: Secondary | ICD-10-CM | POA: Diagnosis not present

## 2022-01-21 DIAGNOSIS — Z87891 Personal history of nicotine dependence: Secondary | ICD-10-CM | POA: Insufficient documentation

## 2022-01-21 DIAGNOSIS — Z6827 Body mass index (BMI) 27.0-27.9, adult: Secondary | ICD-10-CM | POA: Diagnosis not present

## 2022-01-21 DIAGNOSIS — Z01818 Encounter for other preprocedural examination: Secondary | ICD-10-CM

## 2022-01-21 HISTORY — PX: IMPLANTATION OF HYPOGLOSSAL NERVE STIMULATOR: SHX6827

## 2022-01-21 SURGERY — INSERTION, HYPOGLOSSAL NERVE STIMULATOR
Anesthesia: General | Site: Neck | Laterality: Right

## 2022-01-21 MED ORDER — CEFAZOLIN SODIUM-DEXTROSE 2-4 GM/100ML-% IV SOLN
INTRAVENOUS | Status: AC
  Filled 2022-01-21: qty 100

## 2022-01-21 MED ORDER — SUCCINYLCHOLINE CHLORIDE 200 MG/10ML IV SOSY
PREFILLED_SYRINGE | INTRAVENOUS | Status: DC | PRN
  Administered 2022-01-21: 120 mg via INTRAVENOUS

## 2022-01-21 MED ORDER — LIDOCAINE-EPINEPHRINE 1 %-1:100000 IJ SOLN
INTRAMUSCULAR | Status: DC | PRN
  Administered 2022-01-21: 4 mL

## 2022-01-21 MED ORDER — FENTANYL CITRATE (PF) 100 MCG/2ML IJ SOLN: 25.0000 ug | INTRAMUSCULAR | Status: DC | PRN

## 2022-01-21 MED ORDER — PROPOFOL 10 MG/ML IV BOLUS
INTRAVENOUS | Status: DC | PRN
  Administered 2022-01-21: 150 mg via INTRAVENOUS

## 2022-01-21 MED ORDER — OXYCODONE HCL 5 MG PO TABS: 5.0000 mg | ORAL_TABLET | Freq: Once | ORAL | Status: DC | PRN

## 2022-01-21 MED ORDER — ROCURONIUM BROMIDE 10 MG/ML (PF) SYRINGE
PREFILLED_SYRINGE | INTRAVENOUS | Status: AC
  Filled 2022-01-21: qty 10

## 2022-01-21 MED ORDER — SUCCINYLCHOLINE CHLORIDE 200 MG/10ML IV SOSY
PREFILLED_SYRINGE | INTRAVENOUS | Status: AC
  Filled 2022-01-21: qty 10

## 2022-01-21 MED ORDER — LIDOCAINE 2% (20 MG/ML) 5 ML SYRINGE
INTRAMUSCULAR | Status: DC | PRN
  Administered 2022-01-21: 60 mg via INTRAVENOUS

## 2022-01-21 MED ORDER — PHENYLEPHRINE HCL-NACL 20-0.9 MG/250ML-% IV SOLN
INTRAVENOUS | Status: DC | PRN
  Administered 2022-01-21: 20 ug/min via INTRAVENOUS

## 2022-01-21 MED ORDER — DEXAMETHASONE SODIUM PHOSPHATE 4 MG/ML IJ SOLN
INTRAMUSCULAR | Status: DC | PRN
  Administered 2022-01-21: 5 mg via INTRAVENOUS

## 2022-01-21 MED ORDER — CEFAZOLIN SODIUM-DEXTROSE 2-3 GM-%(50ML) IV SOLR
INTRAVENOUS | Status: DC | PRN
  Administered 2022-01-21: 2 g via INTRAVENOUS

## 2022-01-21 MED ORDER — GLYCOPYRROLATE PF 0.2 MG/ML IJ SOSY
PREFILLED_SYRINGE | INTRAMUSCULAR | Status: AC
  Filled 2022-01-21: qty 2

## 2022-01-21 MED ORDER — LIDOCAINE 2% (20 MG/ML) 5 ML SYRINGE
INTRAMUSCULAR | Status: AC
  Filled 2022-01-21: qty 10

## 2022-01-21 MED ORDER — HYDROCODONE-ACETAMINOPHEN 5-325 MG PO TABS: ORAL_TABLET | ORAL | 0 refills | Status: DC

## 2022-01-21 MED ORDER — ONDANSETRON HCL 4 MG/2ML IJ SOLN: 4.0000 mg | Freq: Once | INTRAMUSCULAR | Status: DC | PRN

## 2022-01-21 MED ORDER — AMISULPRIDE (ANTIEMETIC) 5 MG/2ML IV SOLN: 10.0000 mg | Freq: Once | INTRAVENOUS | Status: DC | PRN

## 2022-01-21 MED ORDER — DEXMEDETOMIDINE HCL IN NACL 80 MCG/20ML IV SOLN
INTRAVENOUS | Status: DC | PRN
  Administered 2022-01-21: 8 ug via BUCCAL

## 2022-01-21 MED ORDER — 0.9 % SODIUM CHLORIDE (POUR BTL) OPTIME
TOPICAL | Status: DC | PRN
  Administered 2022-01-21: 30 mL

## 2022-01-21 MED ORDER — DEXAMETHASONE SODIUM PHOSPHATE 10 MG/ML IJ SOLN
INTRAMUSCULAR | Status: AC
  Filled 2022-01-21: qty 1

## 2022-01-21 MED ORDER — OXYCODONE HCL 5 MG/5ML PO SOLN: 5.0000 mg | Freq: Once | ORAL | Status: DC | PRN

## 2022-01-21 MED ORDER — FENTANYL CITRATE (PF) 100 MCG/2ML IJ SOLN
INTRAMUSCULAR | Status: AC
  Filled 2022-01-21: qty 2

## 2022-01-21 MED ORDER — PHENYLEPHRINE 80 MCG/ML (10ML) SYRINGE FOR IV PUSH (FOR BLOOD PRESSURE SUPPORT)
PREFILLED_SYRINGE | INTRAVENOUS | Status: AC
  Filled 2022-01-21: qty 10

## 2022-01-21 MED ORDER — ONDANSETRON HCL 4 MG/2ML IJ SOLN
INTRAMUSCULAR | Status: DC | PRN
  Administered 2022-01-21: 4 mg via INTRAVENOUS

## 2022-01-21 MED ORDER — ACETAMINOPHEN 500 MG PO TABS
1000.0000 mg | ORAL_TABLET | Freq: Once | ORAL | Status: AC
  Administered 2022-01-21: 1000 mg via ORAL

## 2022-01-21 MED ORDER — ONDANSETRON HCL 4 MG/2ML IJ SOLN
INTRAMUSCULAR | Status: AC
  Filled 2022-01-21: qty 8

## 2022-01-21 MED ORDER — ACETAMINOPHEN 500 MG PO TABS
ORAL_TABLET | ORAL | Status: AC
  Filled 2022-01-21: qty 2

## 2022-01-21 MED ORDER — PROPOFOL 500 MG/50ML IV EMUL
INTRAVENOUS | Status: DC | PRN
  Administered 2022-01-21: 35 ug/kg/min via INTRAVENOUS

## 2022-01-21 MED ORDER — FENTANYL CITRATE (PF) 100 MCG/2ML IJ SOLN
INTRAMUSCULAR | Status: DC | PRN
  Administered 2022-01-21 (×2): 50 ug via INTRAVENOUS

## 2022-01-21 MED ORDER — LACTATED RINGERS IV SOLN: INTRAVENOUS | Status: DC

## 2022-01-21 SURGICAL SUPPLY — 73 items
ACC NRSTM 4 TRQ WRNCH STRL (MISCELLANEOUS)
ADH SKN CLS APL DERMABOND .7 (GAUZE/BANDAGES/DRESSINGS) ×1
ATTRACTOMAT 16X20 MAGNETIC DRP (DRAPES) IMPLANT
BLADE CLIPPER SURG (BLADE) IMPLANT
BLADE SURG 15 STRL LF DISP TIS (BLADE) ×2 IMPLANT
BLADE SURG 15 STRL SS (BLADE) ×1
CANISTER SUCT 1200ML W/VALVE (MISCELLANEOUS) ×1 IMPLANT
CORD BIPOLAR FORCEPS 12FT (ELECTRODE) ×1 IMPLANT
COVER PROBE CYLINDRICAL 5X96 (MISCELLANEOUS) ×1 IMPLANT
DERMABOND ADVANCED .7 DNX12 (GAUZE/BANDAGES/DRESSINGS) ×1 IMPLANT
DRAPE C-ARM 35X43 STRL (DRAPES) ×2 IMPLANT
DRAPE INCISE IOBAN 66X45 STRL (DRAPES) ×2 IMPLANT
DRAPE MICROSCOPE WILD 40.5X102 (DRAPES) ×2 IMPLANT
DRAPE UTILITY XL STRL (DRAPES) ×2 IMPLANT
DRSG TEGADERM 2-3/8X2-3/4 SM (GAUZE/BANDAGES/DRESSINGS) ×2 IMPLANT
DRSG TEGADERM 4X4.75 (GAUZE/BANDAGES/DRESSINGS) IMPLANT
ELECT COATED BLADE 2.86 ST (ELECTRODE) ×1 IMPLANT
ELECT EMG 18 NIMS (NEUROSURGERY SUPPLIES) ×1
ELECT REM PT RETURN 9FT ADLT (ELECTROSURGICAL) ×1
ELECTRODE EMG 18 NIMS (NEUROSURGERY SUPPLIES) ×2 IMPLANT
ELECTRODE REM PT RTRN 9FT ADLT (ELECTROSURGICAL) ×2 IMPLANT
FORCEPS BIPOLAR SPETZLER 8 1.0 (NEUROSURGERY SUPPLIES) ×1 IMPLANT
GAUZE 4X4 16PLY ~~LOC~~+RFID DBL (SPONGE) ×1 IMPLANT
GAUZE SPONGE 4X4 12PLY STRL (GAUZE/BANDAGES/DRESSINGS) ×2 IMPLANT
GENERATOR PULSE INSPIRE (Generator) ×1 IMPLANT
GENERATOR PULSE INSPIRE IV (Generator) ×2 IMPLANT
GLOVE BIO SURGEON STRL SZ 6.5 (GLOVE) IMPLANT
GLOVE BIOGEL M 7.0 STRL (GLOVE) ×1 IMPLANT
GLOVE BIOGEL PI IND STRL 7.0 (GLOVE) IMPLANT
GLOVE SURG SS PI 6.5 STRL IVOR (GLOVE) IMPLANT
GOWN STRL REUS W/ TWL LRG LVL3 (GOWN DISPOSABLE) ×6 IMPLANT
GOWN STRL REUS W/TWL LRG LVL3 (GOWN DISPOSABLE) ×5
IV CATH 18G SAFETY (IV SOLUTION) ×1 IMPLANT
KIT NEURO ACCESSORY W/WRENCH (MISCELLANEOUS) IMPLANT
LEAD SENSING RESP INSPIRE (Lead) ×1 IMPLANT
LEAD SENSING RESP INSPIRE IV (Lead) ×2 IMPLANT
LEAD SLEEP STIM INSPIRE IV/V (Lead) ×1 IMPLANT
LEAD SLEEP STIMULATION INSPIRE (Lead) ×1 IMPLANT
LOOP VASCULAR MINI 18 RED (MISCELLANEOUS) ×1
LOOP VESSEL MAXI 1X406 BLUE (MISCELLANEOUS) ×1
LOOP VESSEL MAXI BLUE (MISCELLANEOUS) ×1 IMPLANT
MARKER SKIN DUAL TIP RULER LAB (MISCELLANEOUS) ×1 IMPLANT
NDL HYPO 25X1 1.5 SAFETY (NEEDLE) ×1 IMPLANT
NEEDLE HYPO 25X1 1.5 SAFETY (NEEDLE) ×1 IMPLANT
NS IRRIG 1000ML POUR BTL (IV SOLUTION) ×2 IMPLANT
PACK BASIN DAY SURGERY FS (CUSTOM PROCEDURE TRAY) ×2 IMPLANT
PACK ENT DAY SURGERY (CUSTOM PROCEDURE TRAY) ×1 IMPLANT
PASSER CATH 36 CODMAN DISP (NEUROSURGERY SUPPLIES) IMPLANT
PASSER CATH 38CM DISP (INSTRUMENTS) IMPLANT
PENCIL SMOKE EVACUATOR (MISCELLANEOUS) ×1 IMPLANT
PROBE NERVE STIMULATOR (NEUROSURGERY SUPPLIES) ×2 IMPLANT
REMOTE CONTROL SLEEP INSPIRE (MISCELLANEOUS) ×1 IMPLANT
SET WALTER ACTIVATION W/DRAPE (SET/KITS/TRAYS/PACK) ×1 IMPLANT
SLEEVE SCD COMPRESS KNEE MED (STOCKING) ×1 IMPLANT
SPIKE FLUID TRANSFER (MISCELLANEOUS) ×2 IMPLANT
SPONGE INTESTINAL PEANUT (DISPOSABLE) ×1 IMPLANT
STAPLER VISISTAT 35W (STAPLE) ×2 IMPLANT
SUT SILK 2 0 SH (SUTURE) ×1 IMPLANT
SUT SILK 3 0 RB1 (SUTURE) IMPLANT
SUT SILK 3 0 REEL (SUTURE) ×2 IMPLANT
SUT SILK 3 0 SH 30 (SUTURE) IMPLANT
SUT SILK 3-0 (SUTURE) ×1
SUT SILK 3-0 RB1 30XBRD (SUTURE) ×1
SUT VIC AB 3-0 SH 27 (SUTURE) ×1
SUT VIC AB 3-0 SH 27X BRD (SUTURE) ×1 IMPLANT
SUT VIC AB 4-0 PS2 27 (SUTURE) ×4 IMPLANT
SUT VIC AB 5-0 P-3 18X BRD (SUTURE) IMPLANT
SUT VIC AB 5-0 P3 18 (SUTURE) ×2
SUTURE SILK 3-0 RB1 30XBRD (SUTURE) ×1 IMPLANT
SYR 10ML LL (SYRINGE) ×2 IMPLANT
SYR BULB EAR ULCER 3OZ GRN STR (SYRINGE) ×1 IMPLANT
TOWEL GREEN STERILE FF (TOWEL DISPOSABLE) ×2 IMPLANT
VASCULAR TIE MINI RED 18IN STL (MISCELLANEOUS) ×1 IMPLANT

## 2022-01-21 NOTE — Op Note (Signed)
Operative Note: INSPIRE IMPLANT  Patient: Shawn Bentley  Medical record number: [id number]  Date:01/21/2022  Pre-operative Indications: Moderate/severe obstructive sleep apnea with positive airway pressure intolerance  Postoperative Indications: Same  Surgical Procedure:  1.  12th cranial nerve (hypoglossal) stimulation implant    2.  Placement of chest wall respiratory sensor    3.  Electronic analysis of implanted neurostimulator with pulse generator system  Anesthesia: GET  Surgeon: Delsa Bern, M.D.  Assist: RNFA  BMI: 26.9 kg/m  Signs and symptoms of the patient's obstructive sleep apnea include poor sleep quality, daytime fatigue and hypersomnolence and inability to tolerate CPAP.  Indications for hypoglossal nerve stimulator are significant obstructive sleep apnea with inability to tolerate medical treatment.  Complications: None  EBL: 50 cc   Brief History: The patient is a 76 y.o. male with a history of moderate/severe obstructive sleep apnea.  The patient has undergone work-up including sleep study and trial of CPAP which they did not tolerate.  Patient is unable to use long-term CPAP for control of obstructive sleep apnea.  Patient underwent DISE which showed anterior to posterior airway obstruction, considered a good candidate for Inspire Implant. Given the patient's history and findings, I recommended Inspire Implant under general anesthesia, risks and benefits were discussed in detail with the patient and family. They understand and agree with our plan for surgery which is scheduled at [facility] on an elective basis.  Surgical Procedure: The patient is brought to the operating room on 01/21/2022 and placed in supine position on the operating table. General endotracheal anesthesia was established without difficulty. When the patient was adequately anesthetized, surgical timeout was performed and correct identification of the patient and the surgical procedure. The  patient was injected with 5 cc of 1% lidocaine 1:100,000 dilution epinephrine in subcutaneous fashion in the proposed skin incisions.  The patient was positioned and prepped and draped in sterile fashion.  The NIMS monitoring system was positioned and electrodes were placed in the anterior floor of mouth and tongue to assess the genioglossus and styloglossus muscle groups.  Nerve monitoring was used throughout the surgical procedure.  The procedure was begun by creating a modified right submandibular incision in the upper anterior lateral neck ~2 cm below the mandible and in a natural skin crease.  The incision was carried through the skin and underlying subcutaneous tissue to the level of the platysma.  Platysma muscle was divided and subplatysmal flaps were elevated superiorly and inferiorly.  The submandibular space was entered and the submandibular gland was identified and retracted posteriorly.  The digastric tendon was identified and dissection was carried out anterior and posterior along the tendon and muscle bellies.  The mylohyoid muscle was identified and retracted anteriorly and the hypoglossal nerve was carefully dissected across the anterior floor of the submandibular space.  Lateral branches to the retrusor muscles were identified and tested intraoperatively using the NIMS stimulator.  Stimulation electrode cuff for the hypoglossal nerve stimulator was placed distal to these branches on the medial hypoglossal nerve branch innervating the genioglossus muscle.  Diagnostic evaluation confirmed activation of the genioglossus muscle, resulting in genioglossal activation and tongue protrusion which was visually confirmed in the operating room.  The stimulation electrode was  sutured to the digastric tendon with interrupted 4-0 silk sutures.  A second second incision was created in the anterior upper right chest wall overlying the second rib interspace.  Incision was carried through the skin and  underlying subcutaneous tissue to the level of the pectoralis  major muscle.  The IPG pocket was created deep to the subcutaneous layer and superficial to the pectoralis muscle.  Placement of the stimulation lead was then undertaken by gentle dissection through the pectoralis major muscle parallel to the muscle fibers overlying the second rib interspace.  The subpectoral fat plane was then divided bluntly and the medial border of the external intercostal muscle was identified.  1 cm posterior to the medial border, dissection was carried through the external intercostal and a plane was developed in the second rib interspace.  The sensor lead was then tunneled into the second rib interspace and sutured with 3-0 silk suture to secure it in proper orientation with the sensing probe facing the pleural space.  The pectoralis major muscle dissection was then brought back into its normal anatomic position and the sense lead was brought out into the subcutaneous pocket.  The lead for the stimulating electrode was then tunneled in a subplatysmal fashion from the submandibular incision to the anterior chest wall incision.  The stimulating electrode and respiration sensing lead were connected to the implantable pulse generator.  Diagnostic evaluation was run confirming respiratory signal and good tongue protrusion on stimulation.  The implantable pulse generator was then placed in the subclavicular pocket and sutured to the pectoralis fascia with 2-0 silk sutures sutures.  The incisions were thoroughly irrigated with bacitracin saline irrigation.  The incisions were then closed in multiple layers with 4-0 and 5-0 Vicryl interrupted sutures in the deep and superficial subcutaneous levels at each incision site.  Dermabond surgical glue was used for skin closure.  The patient's incisions were dressed with rolled gauze and Hypafix tape for pressure dressing.    An orogastric tube was passed and stomach contents were aspirated.  Patient was awakened from anesthetic and transferred from the operating room to the recovery room in stable condition. There were no complications and blood loss was minimal.  X-rays were obtained in the recovery room to assess the proper location of the pulse generator, sensor lead and stimulation lead.   Delsa Bern, M.D. [facility] 01/21/2022

## 2022-01-21 NOTE — Transfer of Care (Signed)
Immediate Anesthesia Transfer of Care Note  Patient: Shawn Bentley  Procedure(s) Performed: IMPLANTATION OF HYPOGLOSSAL NERVE STIMULATOR (Right: Neck)  Patient Location: PACU  Anesthesia Type:General  Level of Consciousness: drowsy and patient cooperative  Airway & Oxygen Therapy: Patient Spontanous Breathing and Patient connected to face mask oxygen  Post-op Assessment: Report given to RN and Post -op Vital signs reviewed and stable  Post vital signs: Reviewed and stable  Last Vitals:  Vitals Value Taken Time  BP    Temp    Pulse    Resp    SpO2      Last Pain:  Vitals:   01/21/22 0808  TempSrc: Oral  PainSc: 2          Complications: No notable events documented.

## 2022-01-21 NOTE — Anesthesia Procedure Notes (Signed)
Procedure Name: Intubation Date/Time: 01/21/2022 9:46 AM  Performed by: Signe Colt, CRNAPre-anesthesia Checklist: Patient identified, Emergency Drugs available, Suction available and Patient being monitored Patient Re-evaluated:Patient Re-evaluated prior to induction Oxygen Delivery Method: Circle system utilized Preoxygenation: Pre-oxygenation with 100% oxygen Induction Type: IV induction Ventilation: Mask ventilation without difficulty Laryngoscope Size: Mac and 3 Grade View: Grade I Tube type: Oral Tube size: 7.5 mm Number of attempts: 1 Airway Equipment and Method: Stylet and Oral airway Placement Confirmation: ETT inserted through vocal cords under direct vision, positive ETCO2 and breath sounds checked- equal and bilateral Secured at: 22 cm Tube secured with: Tape Dental Injury: Teeth and Oropharynx as per pre-operative assessment

## 2022-01-21 NOTE — Anesthesia Preprocedure Evaluation (Addendum)
Anesthesia Evaluation  Patient identified by MRN, date of birth, ID band Patient awake    Reviewed: Allergy & Precautions, NPO status , Patient's Chart, lab work & pertinent test results  Airway Mallampati: III  TM Distance: >3 FB Neck ROM: Full    Dental  (+) Missing, Dental Advisory Given,    Pulmonary sleep apnea , former smoker Quit smoking [id number], 6 pack year history    Pulmonary exam normal breath sounds clear to auscultation       Cardiovascular negative cardio ROS Normal cardiovascular exam Rhythm:Regular Rate:Normal     Neuro/Psych RLS  negative psych ROS   GI/Hepatic Neg liver ROS,GERD  Controlled and Medicated,,  Endo/Other  negative endocrine ROS    Renal/GU negative Renal ROS  negative genitourinary   Musculoskeletal  (+) Arthritis , Osteoarthritis,    Abdominal   Peds  Hematology  (+) Blood dyscrasia Hereditary hemochromatosis  Myelodysplastic syndrome    Anesthesia Other Findings   Reproductive/Obstetrics negative OB ROS                             Anesthesia Physical Anesthesia Plan  ASA: 3  Anesthesia Plan: General   Post-op Pain Management: Tylenol PO (pre-op)*   Induction: Intravenous  PONV Risk Score and Plan: 2 and Ondansetron, Dexamethasone and Treatment may vary due to age or medical condition  Airway Management Planned: Oral ETT  Additional Equipment: None  Intra-op Plan:   Post-operative Plan: Extubation in OR  Informed Consent: I have reviewed the patients History and Physical, chart, labs and discussed the procedure including the risks, benefits and alternatives for the proposed anesthesia with the patient or authorized representative who has indicated his/her understanding and acceptance.     Dental advisory given  Plan Discussed with: CRNA  Anesthesia Plan Comments:        Anesthesia Quick Evaluation

## 2022-01-21 NOTE — Discharge Instructions (Addendum)
Next dose of Tylenol due 2:15 today if needed   Post Anesthesia Home Care Instructions  Activity: Get plenty of rest for the remainder of the day. A responsible individual must stay with you for 24 hours following the procedure.  For the next 24 hours, DO NOT: -Drive a car -Paediatric nurse -Drink alcoholic beverages -Take any medication unless instructed by your physician -Make any legal decisions or sign important papers.  Meals: Start with liquid foods such as gelatin or soup. Progress to regular foods as tolerated. Avoid greasy, spicy, heavy foods. If nausea and/or vomiting occur, drink only clear liquids until the nausea and/or vomiting subsides. Call your physician if vomiting continues.  Special Instructions/Symptoms: Your throat may feel dry or sore from the anesthesia or the breathing tube placed in your throat during surgery. If this causes discomfort, gargle with warm salt water. The discomfort should disappear within 24 hours.  If you had a scopolamine patch placed behind your ear for the management of post- operative nausea and/or vomiting:  1. The medication in the patch is effective for 72 hours, after which it should be removed.  Wrap patch in a tissue and discard in the trash. Wash hands thoroughly with soap and water. 2. You may remove the patch earlier than 72 hours if you experience unpleasant side effects which may include dry mouth, dizziness or visual disturbances. 3. Avoid touching the patch. Wash your hands with soap and water after contact with the patch.

## 2022-01-21 NOTE — H&P (Signed)
Shawn Bentley is an 76 y.o. male.   Chief Complaint: Moderately severe obstructive sleep apnea HPI: Patient with a history of obstructive sleep apnea, unable to tolerate adequate CPAP control for long-term management.  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  Past Medical History:  Diagnosis Date   Anemia    Arthritis    BPH (benign prostatic hyperplasia)    Dyspnea    with exertion   Former smoker    GERD (gastroesophageal reflux disease)    Hereditary hemochromatosis ([place])    MDS (myelodysplastic syndrome), low grade ([place])    Sleep apnea    does not use CPAP    Past Surgical History:  Procedure Laterality Date   APPENDECTOMY     DRUG INDUCED ENDOSCOPY N/A 11/27/2021   Procedure: DRUG INDUCED SLEEP ENDOSCOPY;  Surgeon: Jerrell Belfast, MD;  Location: [facility];  Service: ENT;  Laterality: N/A;   HERNIA REPAIR Bilateral    NASAL SEPTOPLASTY W/ TURBINOPLASTY Bilateral 11/27/2021   Procedure: NASAL SEPTOPLASTY WITH TURBINATE REDUCTION;  Surgeon: Jerrell Belfast, MD;  Location: [facility];  Service: ENT;  Laterality: Bilateral;   SHOULDER ARTHROSCOPY Right     Family History  Problem Relation Age of Onset   Heart attack Father    Heart disease Father    Social History:  reports that he quit smoking about 49 years ago. His smoking use included cigarettes. He has a 6.00 pack-year smoking history. He quit smokeless tobacco use about 49 years ago. He reports current alcohol use. He reports that he does not use drugs.  Allergies:  Allergies  Allergen Reactions   Pseudoephedrine-Acetaminophen Other (See Comments)    jittery   Sinus Congestion-Pain Daytime  [Phenylephrine-Acetaminophen] Anxiety    jittery   Sinus [Pseudoephedrine-Acetaminophen] Other (See Comments)    jittery   Meclizine Other (See Comments)    cloudy    Medications Prior to Admission  Medication Sig Dispense Refill    cyclobenzaprine (FLEXERIL) 10 MG tablet Take 10 mg by mouth 3 (three) times daily as needed for muscle spasms.     dutasteride (AVODART) 0.5 MG capsule Take 0.5 mg by mouth at bedtime.     famotidine (PEPCID) 40 MG tablet Take 40 mg by mouth 2 (two) times daily.     gabapentin (NEURONTIN) 300 MG capsule Take 300 mg by mouth 2 (two) times daily.     glucosamine-chondroitin 500-400 MG tablet Take 1 tablet by mouth 3 (three) times daily.     Mag Aspart-Potassium Aspart (POTASSIUM & MAGNESIUM ASPARTAT) 250-250 MG CAPS Take by mouth.     naproxen sodium (ALEVE) 220 MG tablet Take 220 mg by mouth.     pantoprazole (PROTONIX) 40 MG tablet Take 40 mg by mouth 2 (two) times daily.     tamsulosin (FLOMAX) 0.4 MG CAPS capsule Take 0.4 mg by mouth at bedtime.     Turmeric 500 MG TABS Take by mouth.     albuterol (VENTOLIN HFA) 108 (90 Base) MCG/ACT inhaler Inhale into the lungs every 6 (six) hours as needed for wheezing or shortness of breath.      No results found for this or any previous visit (from the past 48 hour(s)). No results found.  Review of Systems  Respiratory:  Positive for apnea.     Blood pressure (!) 141/51, pulse (!) 54, temperature (!) 97.5 F (36.4 C), temperature source Oral, resp. rate 20, height '5\' 7"'$  (1.702 m), weight 77.9 kg, SpO2 100 %.  Physical Exam Constitutional:      Appearance: Normal appearance.  Cardiovascular:     Rate and Rhythm: Normal rate.  Pulmonary:     Effort: Pulmonary effort is normal.  Musculoskeletal:     Cervical back: Normal range of motion.  Neurological:     Mental Status: He is alert.      Assessment/Plan Patient scheduled for [employer] Implant-hypoglossal nerve stimulation for treatment of obstructive sleep apnea as an outpatient under general anesthesia.  Jerrell Belfast, MD 01/21/2022, 9:02 AM

## 2022-01-22 ENCOUNTER — Encounter (HOSPITAL_BASED_OUTPATIENT_CLINIC_OR_DEPARTMENT_OTHER): Payer: Self-pay | Admitting: Otolaryngology

## 2022-01-22 NOTE — Anesthesia Postprocedure Evaluation (Signed)
Anesthesia Post Note  Patient: Shawn Bentley  Procedure(Bentley) Performed: IMPLANTATION OF HYPOGLOSSAL NERVE STIMULATOR (Right: Neck)     Patient location during evaluation: PACU Anesthesia Type: General Level of consciousness: awake and alert Pain management: pain level controlled Vital Signs Assessment: post-procedure vital signs reviewed and stable Respiratory status: spontaneous breathing, nonlabored ventilation, respiratory function stable and patient connected to nasal cannula oxygen Cardiovascular status: blood pressure returned to baseline and stable Postop Assessment: no apparent nausea or vomiting Anesthetic complications: no   No notable events documented.  Last Vitals:  Vitals:   01/21/22 1315 01/21/22 1351  BP: 129/80 (!) 133/55  Pulse: 67 65  Resp: 13 20  Temp:  36.8 C  SpO2: 94% 96%    Last Pain:  Vitals:   01/21/22 1351  TempSrc: Oral  PainSc: 0-No pain                 Shawn Bentley

## 2022-04-08 ENCOUNTER — Other Ambulatory Visit: Payer: Self-pay

## 2022-04-08 ENCOUNTER — Emergency Department (HOSPITAL_BASED_OUTPATIENT_CLINIC_OR_DEPARTMENT_OTHER): Payer: Medicare HMO | Admitting: Radiology

## 2022-04-08 ENCOUNTER — Emergency Department (HOSPITAL_BASED_OUTPATIENT_CLINIC_OR_DEPARTMENT_OTHER)
Admission: EM | Admit: 2022-04-08 | Discharge: 2022-04-08 | Disposition: A | Discharge status: Home / Self Care | Payer: Medicare HMO | Attending: Emergency Medicine | Admitting: Emergency Medicine

## 2022-04-08 ENCOUNTER — Encounter (HOSPITAL_BASED_OUTPATIENT_CLINIC_OR_DEPARTMENT_OTHER): Payer: Self-pay | Admitting: Emergency Medicine

## 2022-04-08 DIAGNOSIS — R0602 Shortness of breath: Secondary | ICD-10-CM | POA: Insufficient documentation

## 2022-04-08 DIAGNOSIS — R079 Chest pain, unspecified: Secondary | ICD-10-CM | POA: Diagnosis not present

## 2022-04-08 LAB — COMPREHENSIVE METABOLIC PANEL
ALT: 19 U/L (ref 0–44)
AST: 17 U/L (ref 15–41)
Albumin: 4.6 g/dL (ref 3.5–5.0)
Alkaline Phosphatase: 45 U/L (ref 38–126)
Anion gap: 7 (ref 5–15)
BUN: 16 mg/dL (ref 8–23)
CO2: 26 mmol/L (ref 22–32)
Calcium: 9.6 mg/dL (ref 8.9–10.3)
Chloride: 106 mmol/L (ref 98–111)
Creatinine, Ser: 1.06 mg/dL (ref 0.61–1.24)
GFR, Estimated: >60 mL/min (ref >60)
Glucose, Bld: 98 mg/dL (ref 70–99)
Potassium: 3.7 mmol/L (ref 3.5–5.1)
Sodium: 139 mmol/L (ref 135–145)
Total Bilirubin: 1.6 mg/dL — ABNORMAL HIGH (ref 0.3–1.2)
Total Protein: 6.4 g/dL — ABNORMAL LOW (ref 6.5–8.1)

## 2022-04-08 LAB — CBC
HCT: 29.2 % — ABNORMAL LOW (ref 39.0–52.0)
Hemoglobin: 9.7 g/dL — ABNORMAL LOW (ref 13.0–17.0)
MCH: 30.8 pg (ref 26.0–34.0)
MCHC: 33.2 g/dL (ref 30.0–36.0)
MCV: 92.7 fL (ref 80.0–100.0)
Platelets: 117 10*3/uL — ABNORMAL LOW (ref 150–400)
RBC: 3.15 MIL/uL — ABNORMAL LOW (ref 4.22–5.81)
RDW: 30.3 % — ABNORMAL HIGH (ref 11.5–15.5)
WBC: 5.4 10*3/uL (ref 4.0–10.5)
nRBC: 0.7 % — ABNORMAL HIGH (ref 0.0–0.2)

## 2022-04-08 LAB — TROPONIN I (HIGH SENSITIVITY)
Troponin I (High Sensitivity): 3 ng/L (ref <18)
Troponin I (High Sensitivity): 4 ng/L (ref <18)

## 2022-04-08 NOTE — ED Triage Notes (Signed)
Pt arrives to ED with c/o chest pain with associated Sob at 1130am today. The CP radiates to his ;eft neck/jaw. Described as dull. The pain started while driving home from [place].

## 2022-04-08 NOTE — ED Notes (Signed)
Patient verbalizes understanding of discharge instructions. Opportunity for questioning and answers were provided. Patient discharged from ED.  °

## 2022-04-08 NOTE — ED Provider Notes (Signed)
  Physical Exam  BP (!) 120/49 (BP Location: Right Arm)   Pulse (!) 56   Temp 98.8 F (37.1 C) (Oral)   Resp 11   Ht '5\' 7"'$  (1.702 m)   Wt 77.1 kg   SpO2 100%   BMI 26.63 kg/m     Procedures  Procedures  ED Course / MDM   Clinical Course as of 04/08/22 1522  Tue Apr 08, 2022  1319 Hgb stable from last year (hgb 9.5) per Outpatient records [MT]  1454 Patient remains asymptomatic and signed out to afternoon ED provider pending follow-up on second troponin level. [MT]    Clinical Course User Index [MT] Trifan, Carola Rhine, MD   Medical Decision Making Amount and/or Complexity of Data Reviewed Labs: ordered. Radiology: ordered.   15M, presenting with left sided chest pressure, delta trop and likely DC. Follow-up with cardiology, Dr. Einar Gip.  Delta troponin negative. Stable for DC with outpatient cardiology follow-up.       Regan Lemming, MD 04/08/22 [phone]

## 2022-04-08 NOTE — Discharge Instructions (Signed)
You were diagnosed with chest pain today.  This is a non-specific diagnosis, but your provider did not feel this was a life-threatening condition at this time.  Chest pain is a common presenting condition in the Emergency Department, and it is not uncommon for patients to leave without a specific diagnosis.  It is important to remember that your workup today was not a complete medical workup.  You may still have a serious medical attention that needs follow up care with a specialist. If your provider referred you to see a specialist, it is VERY important that you call to set up an appointment with them.    It is also important that you speak to your primary care provider in 1-2 days after this visit to the ER.  Your PCP may want to see you in the office, or else they may want you to follow up with the specialist.  [place] IF:  You have severe chest pain, especially if the pain is crushing or pressure-like and spreads to the arms, back, neck, or jaw, or if you have sweating, nausea (feeling sick to your stomach), or shortness of breath. THIS IS AN EMERGENCY. Don't wait to see if the pain will go away. Get medical help at once. Call 911 or 0 (operator). DO NOT drive yourself to the hospital.   Your chest pain gets worse and does not go away with rest.  You have an attack of chest pain lasting longer than usual, despite rest and treatment with the medications your caregiver has prescribed.  You wake from sleep with chest pain or shortness of breath.  You feel dizzy or faint.  You have chest pain not typical of your usual pain for which you originally saw your caregiver.

## 2022-04-08 NOTE — ED Provider Notes (Signed)
[facility] Provider Note   CSN: [id number] Arrival date & time: 04/08/22  1240     History  Chief Complaint  Patient presents with   Chest Pain   Shortness of Breath    Shawn Bentley is a 77 y.o. male presenting to emergency department with left-sided chest pain.  The patient reports that he was feeling fine this morning, went to [employer] to do some shopping.  While he was driving at rest in his car afterwards he began to have discomfort like a heaviness in the left side of his chest associated with shortness of breath.  It lasted about an hour.  At times it radiated into his left neck.  The pain is completely gone away.  He says he has had similar symptoms in the past, and he was evaluated by cardiologist about 5 years ago, Dr Einar Gip, and had testing done including a stress test, was told he did not have any issues with coronary disease.  He has never had an MI or cardiac catheterization.  There is no significant family history of early cardiovascular disease.  The patient quit smoking about 50 years ago.  He denies history of hypertension, high cholesterol, diabetes.  He is currently asymptomatic in the ED.  HPI     Home Medications Prior to Admission medications   Medication Sig Start Date End Date Taking? Authorizing Provider  albuterol (VENTOLIN HFA) 108 (90 Base) MCG/ACT inhaler Inhale into the lungs every 6 (six) hours as needed for wheezing or shortness of breath.    [provider]  cyclobenzaprine (FLEXERIL) 10 MG tablet Take 10 mg by mouth 3 (three) times daily as needed for muscle spasms.    [provider]  dutasteride (AVODART) 0.5 MG capsule Take 0.5 mg by mouth at bedtime.    [provider]  famotidine (PEPCID) 40 MG tablet Take 40 mg by mouth 2 (two) times daily.    [provider]  gabapentin (NEURONTIN) 300 MG capsule Take 300 mg by mouth 2 (two) times daily.    [provider]  glucosamine-chondroitin 500-400 MG tablet Take 1 tablet by mouth 3 (three) times daily.    [provider]  HYDROcodone-acetaminophen (NORCO) 5-325 MG tablet 1 to 2 tablets every 12 hours as needed for operative pain control.  May alternate every 6 hours with Motrin. 01/21/22   Jerrell Belfast, MD  Mag Aspart-Potassium Aspart (POTASSIUM & MAGNESIUM ASPARTAT) 250-250 MG CAPS Take by mouth.    [provider]  naproxen sodium (ALEVE) 220 MG tablet Take 220 mg by mouth.    [provider]  pantoprazole (PROTONIX) 40 MG tablet Take 40 mg by mouth 2 (two) times daily.    [provider]  tamsulosin (FLOMAX) 0.4 MG CAPS capsule Take 0.4 mg by mouth at bedtime.    [provider]  Turmeric 500 MG TABS Take by mouth.    [provider]      Allergies    Pseudoephedrine-acetaminophen, Sinus congestion-pain daytime  [phenylephrine-acetaminophen], Sinus [pseudoephedrine-acetaminophen], and Meclizine    Review of Systems   Review of Systems  Physical Exam Updated Vital Signs BP (!) 120/49 (BP Location: Right Arm)   Pulse (!) 48   Temp 98.8 F (37.1 C) (Oral)   Resp 15   Ht '5\' 7"'$  (1.702 m)   Wt 77.1 kg   SpO2 97%   BMI 26.63 kg/m  Physical Exam Constitutional:      General:  He is not in acute distress. HENT:     Head: Normocephalic and atraumatic.  Eyes:     Conjunctiva/sclera: Conjunctivae normal.     Pupils: Pupils are equal, round, and reactive to light.  Cardiovascular:     Rate and Rhythm: Normal rate and regular rhythm.  Pulmonary:     Effort: Pulmonary effort is normal. No respiratory distress.  Abdominal:     General: There is no distension.     Tenderness: There is no abdominal tenderness.  Skin:    General: Skin is warm and dry.  Neurological:     General: No focal deficit present.     Mental Status: He is alert. Mental status is at baseline.  Psychiatric:        Mood and Affect: Mood normal.        Behavior:  Behavior normal.     ED Results / Procedures / Treatments   Labs (all labs ordered are listed, but only abnormal results are displayed) Labs Reviewed  CBC - Abnormal; Notable for the following components:      Result Value   RBC 3.15 (*)    Hemoglobin 9.7 (*)    HCT 29.2 (*)    RDW 30.3 (*)    Platelets 117 (*)    nRBC 0.7 (*)    All other components within normal limits  COMPREHENSIVE METABOLIC PANEL - Abnormal; Notable for the following components:   Total Protein 6.4 (*)    Total Bilirubin 1.6 (*)    All other components within normal limits  TROPONIN I (HIGH SENSITIVITY)  TROPONIN I (HIGH SENSITIVITY)    EKG EKG Interpretation  Date/Time:  Tuesday April 08 2022 12:45:50 EST Ventricular Rate:  49 PR Interval:  159 QRS Duration: 103 QT Interval:  425 QTC Calculation: 384 R Axis:   27 Text Interpretation: Sinus bradycardia Confirmed by Octaviano Glow [phone]) on 04/08/2022 12:59:11 PM  Radiology DG Chest 2 View  Result Date: 04/08/2022 CLINICAL DATA:  Chest pain. EXAM: CHEST - 2 VIEW COMPARISON:  01/21/2022 FINDINGS: Stable appearance of nerve stimulator generator in the right chest wall with lead extending into the right neck. The heart size and mediastinal contours are within normal limits. There is no evidence of pulmonary edema, consolidation, pneumothorax, nodule or pleural fluid. The visualized skeletal structures are unremarkable. IMPRESSION: No active cardiopulmonary disease. Electronically Signed   By: Aletta Edouard M.D.   On: 04/08/2022 13:05    Procedures Procedures    Medications Ordered in ED Medications - No data to display  ED Course/ Medical Decision Making/ A&P Clinical Course as of 04/08/22 1536  Tue Apr 08, 2022  1319 Hgb stable from last year (hgb 9.5) per Outpatient records [MT]  1454 Patient remains asymptomatic and signed out to afternoon ED provider pending follow-up on second troponin level. [MT]    Clinical Course User Index [MT]  Chandlar Guice, Carola Rhine, MD                             Medical Decision Making Amount and/or Complexity of Data Reviewed Labs: ordered. Radiology: ordered.   This patient presents to the Emergency Department with complaint of chest pain. This involves an extensive number of treatment options, and is a complaint that carries with it a high risk of complications and morbidity, given the patient's comorbidity, including former smoking.The differential diagnosis includes ACS vs Pneumothorax vs Reflux/Gastritis vs MSK pain vs Pneumonia vs other.  I  felt PE was less likely given that patient has no acute risk factors and has had transient symptoms, now resolved  I ordered, reviewed, and interpreted labs.  Pertinent results include initial troponin 3.  Labs are unremarkable and near baseline levels including hemoglobin. I ordered imaging studies which included x-ray of the chest I independently visualized and interpreted imaging which showed no acute abnormalities and the monitor tracing which showed sinus bradycardia and sinus rhythm. I agree with the radiologist interpretation I personally reviewed the patients ECG which showed sinus rhythm with no acute ischemic findings  At the time of signout to Dr Armandina Gemma EDP, the patient remained asymptomatic, no chest pain, pending second troponin.  Second troponin is negative anticipate he can be discharged with follow-up with cardiology.  The patient himself is in agreement with the plan and wanted to go home.  His wife is also present for discussion.         Final Clinical Impression(s) / ED Diagnoses Final diagnoses:  Chest pain, unspecified type    Rx / DC Orders ED Discharge Orders     None         Wyvonnia Dusky, MD 04/08/22 1536

## 2022-04-11 ENCOUNTER — Encounter: Payer: Self-pay | Admitting: Cardiology

## 2022-04-11 ENCOUNTER — Ambulatory Visit: Payer: Medicare HMO | Admitting: Cardiology

## 2022-04-11 VITALS — BP 143/53 | HR 58 | Resp 16 | Ht 67.0 in | Wt 180.0 lb

## 2022-04-11 DIAGNOSIS — R011 Cardiac murmur, unspecified: Secondary | ICD-10-CM | POA: Insufficient documentation

## 2022-04-11 DIAGNOSIS — R072 Precordial pain: Secondary | ICD-10-CM

## 2022-04-11 NOTE — Progress Notes (Signed)
Patient referred by Nicholes Rough, PA-C for chest pain  Subjective:   Shawn Bentley, male    DOB: [birth year], 77 y.o.   MRN: [id number]   Chief Complaint  Patient presents with   Chest Pain   Hospitalization Follow-up    HPI  77 y.o. Caucasian male with myelodysplastic syndrome, hereditary hemochromatosis, anemia, OSA, chest pain  Patient was last seen in our practice in 2020.  Prior echocardiogram and stress testing results below, no ischemia and mild valvular regurgitation seen then.  Pain was left sided, associated with shortness of breath, after eating. Pain persisted for a while, prompting an ER visit.  ACS was ruled out with normal high-sensitivity troponin.  He was found to be anemic at his baseline hemoglobin level of 9.7.  Patient is here for follow-up of his chest pain. He denies any exertional chest pain. He report that he has had similar pain in the past, which he has attributed to "gas".   Patient has long standing history of MDS as well as hemochromatosis. He tells me that he had been seen by hem/onc and was given prognosis, but he has outlasted the prognostication.    Past Medical History:  Diagnosis Date   Anemia    Arthritis    BPH (benign prostatic hyperplasia)    Dyspnea    with exertion   Former smoker    GERD (gastroesophageal reflux disease)    Hereditary hemochromatosis ([place])    MDS (myelodysplastic syndrome), low grade ([place])    Sleep apnea    does not use CPAP     Past Surgical History:  Procedure Laterality Date   APPENDECTOMY     DRUG INDUCED ENDOSCOPY N/A 11/27/2021   Procedure: DRUG INDUCED SLEEP ENDOSCOPY;  Surgeon: Jerrell Belfast, MD;  Location: [facility];  Service: ENT;  Laterality: N/A;   HERNIA REPAIR Bilateral    IMPLANTATION OF HYPOGLOSSAL NERVE STIMULATOR Right 01/21/2022   Procedure: IMPLANTATION OF HYPOGLOSSAL NERVE STIMULATOR;  Surgeon: Jerrell Belfast, MD;  Location: [facility];  Service: ENT;   Laterality: Right;   NASAL SEPTOPLASTY W/ TURBINOPLASTY Bilateral 11/27/2021   Procedure: NASAL SEPTOPLASTY WITH TURBINATE REDUCTION;  Surgeon: Jerrell Belfast, MD;  Location: [facility];  Service: ENT;  Laterality: Bilateral;   SHOULDER ARTHROSCOPY Right      Social History   Tobacco Use  Smoking Status Former   Packs/day: 1.50   Years: 4.00   Total pack years: 6.00   Types: Cigarettes   Quit date: 07/27/1972   Years since quitting: 49.7  Smokeless Tobacco Former   Quit date: 07/27/1972    Social History   Substance and Sexual Activity  Alcohol Use Yes   Comment: occais     Family History  Problem Relation Age of Onset   Heart attack Father    Heart disease Father       Current Outpatient Medications:    albuterol (VENTOLIN HFA) 108 (90 Base) MCG/ACT inhaler, Inhale into the lungs every 6 (six) hours as needed for wheezing or shortness of breath., Disp: , Rfl:    cyclobenzaprine (FLEXERIL) 10 MG tablet, Take 10 mg by mouth 3 (three) times daily as needed for muscle spasms., Disp: , Rfl:    dutasteride (AVODART) 0.5 MG capsule, Take 0.5 mg by mouth at bedtime., Disp: , Rfl:    famotidine (PEPCID) 40 MG tablet, Take 40 mg by mouth 2 (two) times daily., Disp: , Rfl:    gabapentin (NEURONTIN) 300 MG  capsule, Take 300 mg by mouth 2 (two) times daily., Disp: , Rfl:    glucosamine-chondroitin 500-400 MG tablet, Take 1 tablet by mouth 3 (three) times daily., Disp: , Rfl:    HYDROcodone-acetaminophen (NORCO) 5-325 MG tablet, 1 to 2 tablets every 12 hours as needed for operative pain control.  May alternate every 6 hours with Motrin., Disp: 20 tablet, Rfl: 0   Mag Aspart-Potassium Aspart (POTASSIUM & MAGNESIUM ASPARTAT) 250-250 MG CAPS, Take by mouth., Disp: , Rfl:    naproxen sodium (ALEVE) 220 MG tablet, Take 220 mg by mouth., Disp: , Rfl:    pantoprazole (PROTONIX) 40 MG tablet, Take 40 mg by mouth 2 (two) times daily., Disp: , Rfl:    tamsulosin (FLOMAX) 0.4 MG  CAPS capsule, Take 0.4 mg by mouth at bedtime., Disp: , Rfl:    Turmeric 500 MG TABS, Take by mouth., Disp: , Rfl:    Cardiovascular and other pertinent studies:  Reviewed external labs and tests, independently interpreted  EKG 04/11/2022: Sinus rhythm 58 bpm Normal EKG  Echocardiogram 01/26/2019:  Left ventricle cavity is normal in size and wall thickness. Normal LV  systolic function with EF 59%. Normal global wall motion. Normal diastolic  filling pattern.  Trileaflet aortic valve. Mild (Grade I) aortic regurgitation.  Mild (Grade I) mitral regurgitation.  Mild tricuspid regurgitation. Estimated pulmonary artery systolic pressure  is 26 mmHg.  No significant change compared to prior study in 2018.   Lexiscan Tetrofosmin stress test 01/26/2019: Lexiscan nuclear stress test performed using 1-day protocol. Myocardial perfusion imaging is normal. Left ventricular ejection fraction is  56% with normal wall motion. Low risk study.   Recent labs: 04/08/2022: Glucose 98, BUN/Cr 16/1.06. EGFR >60. Na/K 139/3.7. T.bili 1.6. Rest of the CMP normal H/H 9.7/29.2. MCV 92. Platelets 117 HbA1C NA Lipid panel NA Trop HS 3, 4    Review of Systems  Cardiovascular:  Negative for chest pain, dyspnea on exertion, leg swelling, palpitations and syncope.         Vitals:   04/11/22 0854  BP: (!) 143/53  Pulse: (!) 58  Resp: 16  SpO2: 98%     Body mass index is 28.19 kg/m. Filed Weights   04/11/22 0854  Weight: 180 lb (81.6 kg)     Objective:   Physical Exam Vitals and nursing note reviewed.  Constitutional:      General: He is not in acute distress. Neck:     Vascular: No JVD.  Cardiovascular:     Rate and Rhythm: Normal rate and regular rhythm.     Heart sounds: Murmur heard.     High-pitched blowing holosystolic murmur is present with a grade of 2/6 at the apex.  Pulmonary:     Effort: Pulmonary effort is normal.     Breath sounds: Normal breath sounds. No wheezing  or rales.  Musculoskeletal:     Right lower leg: No edema.     Left lower leg: No edema.         Visit diagnoses:   ICD-10-CM   1. Precordial pain  R07.2 EKG 12-Lead    PCV MYOCARDIAL PERFUSION WITH LEXISCAN    PCV ECHOCARDIOGRAM COMPLETE    CT CARDIAC SCORING (SELF PAY ONLY)       Orders Placed This Encounter  Procedures   CT CARDIAC SCORING (SELF PAY ONLY)   PCV MYOCARDIAL PERFUSION WITH LEXISCAN   EKG 12-Lead   PCV ECHOCARDIOGRAM COMPLETE     Assessment & Recommendations:   Caucasian male  with myelodysplastic syndrome, hereditary hemochromatosis, anemia, OSA, chest pain  Precordial pain: Less likely cardiac. He says he is unable to walk treadmill for exercise due to calg=f pain with certain movements, not necessarily claudication. Ordered Lexiscan nuclear stress test, echocardiogram, calcium score scan.   Further recommendations after above testing.     Thank you for referring the patient to [place]. Please feel free to contact with any questions.   Nigel Mormon, MD Pager: [phone] Office: [phone]

## 2022-04-16 ENCOUNTER — Ambulatory Visit: Payer: Medicare HMO

## 2022-04-16 DIAGNOSIS — R072 Precordial pain: Secondary | ICD-10-CM

## 2022-04-17 ENCOUNTER — Other Ambulatory Visit: Payer: Self-pay

## 2022-04-17 ENCOUNTER — Telehealth: Payer: Self-pay

## 2022-04-17 NOTE — Telephone Encounter (Signed)
error 

## 2022-04-22 ENCOUNTER — Ambulatory Visit: Payer: Medicare HMO

## 2022-04-22 DIAGNOSIS — R072 Precordial pain: Secondary | ICD-10-CM

## 2022-05-16 ENCOUNTER — Ambulatory Visit
Admission: RE | Admit: 2022-05-16 | Discharge: 2022-05-16 | Disposition: A | Discharge status: Home / Self Care | Payer: No Typology Code available for payment source | Source: Ambulatory Visit | Attending: Cardiology

## 2022-05-16 DIAGNOSIS — R072 Precordial pain: Secondary | ICD-10-CM

## 2022-06-03 ENCOUNTER — Other Ambulatory Visit: Payer: Self-pay | Admitting: Cardiology

## 2022-06-03 DIAGNOSIS — E782 Mixed hyperlipidemia: Secondary | ICD-10-CM

## 2022-06-03 DIAGNOSIS — I10 Essential (primary) hypertension: Secondary | ICD-10-CM

## 2022-06-04 ENCOUNTER — Encounter: Payer: Self-pay | Admitting: Cardiology

## 2022-06-04 NOTE — Telephone Encounter (Signed)
Are you prescribing a statin for this patient?

## 2022-06-05 NOTE — Telephone Encounter (Signed)
Crestor 10 mg daily 90 pillsX3 refills  Thanks MJP

## 2022-06-06 ENCOUNTER — Other Ambulatory Visit: Payer: Self-pay

## 2022-06-06 MED ORDER — ROSUVASTATIN CALCIUM 10 MG PO TABS: 10.0000 mg | ORAL_TABLET | Freq: Every day | ORAL | 3 refills | Status: DC

## 2022-06-06 NOTE — Telephone Encounter (Signed)
done

## 2022-06-16 ENCOUNTER — Ambulatory Visit: Payer: Medicare HMO | Admitting: Pulmonary Disease

## 2022-06-16 ENCOUNTER — Encounter: Payer: Self-pay | Admitting: Pulmonary Disease

## 2022-06-16 VITALS — BP 120/70 | HR 61 | Ht 67.0 in | Wt 179.0 lb

## 2022-06-16 DIAGNOSIS — R0602 Shortness of breath: Secondary | ICD-10-CM | POA: Diagnosis not present

## 2022-06-16 DIAGNOSIS — D46Z Other myelodysplastic syndromes: Secondary | ICD-10-CM

## 2022-06-16 NOTE — Patient Instructions (Addendum)
I do agree your symptoms appear to be related to stress  We will check pulmonary function tests to further evaluate your shortness of breath and compare them to the 2018 tests  We will consider inhaler therapy once your breathing tests are completed  Follow up in 2 months.

## 2022-06-16 NOTE — Progress Notes (Signed)
Synopsis: Referred in April 2024 for dyspnea by Ladora Daniel, PA  Subjective:   PATIENT ID: Shawn Bentley, Shawn Bentley  HPI  Chief Complaint  Patient presents with   Consult    Referred by PCP for chest pain and SOB for the past 45 days.    Shawn Bentley is a 77 year old male, former smoker with history of GERD, myelodysplastic syndrome, sleep apnea and hereditary hemochromatosis who is referred to pulmonary clinic for shortness of breath and chest discomfort.   Patient was evaluated in the [facility] for chest pain on 04/08/22. Patient seen at his PCP's office for upper respiratory tract infection in February 04/11/22 and was negative for RSV/Flu.   He has been having chest discomfort centrally and along the left chest for 2 months. He had NM Stress test and echo which are within normal limits.   He feels the symptoms could be related to stress from work and from his daughter who has MS and polysubstance abuse. He took a 1 week vacation to visit his niece in [place] with resolution of his chest discomfort and dyspnea during that week. Symptoms returned upon his return home.   He has Inspire device in place for OSA. He denies night time awakenings due to chest discomfort or dyspnea.   He works as a Product/process development scientist. He has 6 pack year smoking history, quit in 1973. 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 He lives with his wife and daughter. Grew up in [place]. Moved to [place] in 2004. He is taking food grade hydrogen peroxide, taking 21 drops per day.    Past Medical History:  Diagnosis Date   Anemia    Arthritis    BPH (benign prostatic hyperplasia)    Dyspnea    with exertion   Former smoker    GERD (gastroesophageal reflux disease)    Hereditary hemochromatosis    MDS (myelodysplastic syndrome), low grade    Sleep apnea    does not use CPAP     Family History  Problem Relation Age of  Onset   Heart attack Father    Heart disease Father      Social History   Socioeconomic History   Marital status: Married    Spouse name: Not on file   Number of children: 3   Years of education: Not on file   Highest education level: Not on file  Occupational History   Not on file  Tobacco Use   Smoking status: Former    Packs/day: 1.50    Years: 4.00    Additional pack years: 0.00    Total pack years: 6.00    Types: Cigarettes    Quit date: 07/27/1972    Years since quitting: 49.9   Smokeless tobacco: Former    Quit date: 07/27/1972  Vaping Use   Vaping Use: Never used  Substance and Sexual Activity   Alcohol use: Yes    Comment: occais   Drug use: Never   Sexual activity: Yes  Other Topics Concern   Not on file  Social History Narrative   Not on file   Social Determinants of Health   Financial Resource Strain: Not on file  Food Insecurity: Not on file  Transportation Needs: Not on file  Physical Activity: Not on file  Stress: Not on file  Social Connections: Not on file  Intimate Partner Violence: Not on file  Allergies  Allergen Reactions   Pseudoephedrine-Acetaminophen Other (See Comments)    jittery   Sinus Congestion-Pain Daytime  [Phenylephrine-Acetaminophen] Anxiety    jittery   Sinus [Pseudoephedrine-Acetaminophen] Other (See Comments)    jittery   Meclizine Other (See Comments)    cloudy     Outpatient Medications Prior to Visit  Medication Sig Dispense Refill   albuterol (VENTOLIN HFA) 108 (90 Base) MCG/ACT inhaler Inhale 2 puffs into the lungs every 6 (six) hours as needed for wheezing.     cyclobenzaprine (FLEXERIL) 10 MG tablet Take 10 mg by mouth 3 (three) times daily as needed for muscle spasms.     dutasteride (AVODART) 0.5 MG capsule Take 0.5 mg by mouth at bedtime.     famotidine (PEPCID) 40 MG tablet Take 40 mg by mouth 2 (two) times daily.     gabapentin (NEURONTIN) 300 MG capsule Take 300 mg by mouth 2 (two) times daily.      glucosamine-chondroitin 500-400 MG tablet Take 1 tablet by mouth 3 (three) times daily.     Mag Aspart-Potassium Aspart (POTASSIUM & MAGNESIUM ASPARTAT) 250-250 MG CAPS Take by mouth.     magnesium (MAGTAB) 84 MG ( ) TBCR SR tablet Take 84 mg by mouth daily.     naproxen sodium (ALEVE) 220 MG tablet Take 220 mg by mouth.     pantoprazole (PROTONIX) 40 MG tablet Take 40 mg by mouth 2 (two) times daily.     rOPINIRole (REQUIP) 1 MG tablet Take 1 mg by mouth at bedtime.     rosuvastatin (CRESTOR) 10 MG tablet Take 1 tablet (10 mg total) by mouth daily. 90 tablet 3   tamsulosin (FLOMAX) 0.4 MG CAPS capsule Take 0.4 mg by mouth at bedtime.     Turmeric 500 MG TABS Take by mouth.     albuterol (VENTOLIN HFA) 108 (90 Base) MCG/ACT inhaler Inhale into the lungs every 6 (six) hours as needed for wheezing or shortness of breath.     HYDROcodone-acetaminophen (NORCO) 5-325 MG tablet 1 to 2 tablets every 12 hours as needed for operative pain control.  May alternate every 6 hours with Motrin. 20 tablet 0   No facility-administered medications prior to visit.   Review of Systems  Constitutional:  Negative for chills, fever, malaise/fatigue and weight loss.  HENT:  Negative for congestion, sinus pain and sore throat.   Eyes: Negative.   Respiratory:  Positive for shortness of breath. Negative for cough, hemoptysis, sputum production and wheezing.   Cardiovascular:  Positive for chest pain. Negative for palpitations, orthopnea, claudication and leg swelling.  Gastrointestinal:  Negative for abdominal pain, heartburn, nausea and vomiting.  Genitourinary: Negative.   Musculoskeletal:  Positive for joint pain. Negative for myalgias.  Skin:  Negative for rash.  Neurological:  Negative for weakness.  Endo/Heme/Allergies: Negative.   Psychiatric/Behavioral:  Positive for depression.    Objective:   Vitals:   06/16/22 0825  BP: 120/70  Pulse: 61  SpO2: 99%  Weight: 179 lb (81.2 kg)  Height:   (1.702 m)     Physical Exam Constitutional:      General: He is not in acute distress. HENT:     Head: Normocephalic and atraumatic.  Eyes:     Extraocular Movements: Extraocular movements intact.     Conjunctiva/sclera: Conjunctivae normal.     Pupils: Pupils are equal, round, and reactive to light.  Cardiovascular:     Rate and Rhythm: Normal rate and regular rhythm.     Pulses:  Normal pulses.     Heart sounds: Normal heart sounds. No murmur heard. Pulmonary:     Effort: Pulmonary effort is normal.     Breath sounds: Normal breath sounds. No wheezing, rhonchi or rales.  Abdominal:     General: Bowel sounds are normal.     Palpations: Abdomen is soft.  Musculoskeletal:     Right lower leg: No edema.     Left lower leg: No edema.  Lymphadenopathy:     Cervical: No cervical adenopathy.  Skin:    General: Skin is warm and dry.  Neurological:     General: No focal deficit present.     Mental Status: He is alert.  Psychiatric:        Mood and Affect: Mood normal.        Behavior: Behavior normal.        Thought Content: Thought content normal.        Judgment: Judgment normal.    CBC    Component Value Date/Time   WBC 5.4 04/08/2022 1250   RBC 3.15 (L) 04/08/2022 1250   HGB 9.7 (L) 04/08/2022 1250   HGB 12.0 (L) 05/30/2013 0759   HCT 29.2 (L) 04/08/2022 1250   HCT 36.7 (L) 05/30/2013 0759   PLT 117 (L) 04/08/2022 1250   PLT 165 05/30/2013 0759   MCV 92.7 04/08/2022 1250   MCV 94.6 05/30/2013 0759   MCH 30.8 04/08/2022 1250   MCHC 33.2 04/08/2022 1250   RDW 30.3 (H) 04/08/2022 1250   RDW 28.9 (H) 05/30/2013 0759   LYMPHSABS 1.8 05/30/2013 0759   MONOABS 0.6 05/30/2013 0759   EOSABS 0.2 05/30/2013 0759   BASOSABS 0.1 05/30/2013 0759      Latest Ref Rng & Units 04/08/2022   12:50 PM 05/30/2013    7:59 AM 07/27/2012   10:35 AM  BMP  Glucose 70 - 99 mg/dL 98  96  95   BUN 8 - 23 mg/dL 16  49.7  02.6   Creatinine 0.61 - 1.24 mg/dL 3.78  1.0  0.9   Sodium 135 -  145 mmol/L 139  139  141   Potassium 3.5 - 5.1 mmol/L 3.7  4.5  4.4   Chloride 98 - 111 mmol/L 106   108   CO2 22 - 32 mmol/L 26  23  25    Calcium 8.9 - 10.3 mg/dL 9.6  9.3  9.2    Chest imaging: CXR 04/08/22 Stable appearance of nerve stimulator generator in the right chest wall with lead extending into the right neck. The heart size and mediastinal contours are within normal limits. There is no evidence of pulmonary edema, consolidation, pneumothorax, nodule or pleural fluid. The visualized skeletal structures are unremarkable.  CT Chest 08/20/21 1.  No acute findings appreciated within the chest.  2.  Mild diffuse colonic wall thickening concerning for nonspecific colitis. No bowel obstruction.  3.  Appendix not visualized.  4.  Suspect mild hepatic steatosis.  5.  Similar splenomegaly.  6.  Bilateral nephrolithiasis without urinary obstruction.  7.  Right renal cysts.  8.  Mild diffuse bladder wall thickening is nonspecific. Please correlate with urinalysis for potential cystitis.  9.  Remote surgery for right inguinal hernia repair.  10.  Small fat-containing left inguinal hernia.  11.  Mild atherosclerosis, including coronary artery calcifications. No aortic aneurysm.   PFT:    Latest Ref Rng & Units 06/06/2016    3:35 PM  PFT Results  FVC-Pre L 3.76  FVC-Predicted Pre % 90   FVC-Post L 4.02   FVC-Predicted Post % 97   Pre FEV1/FVC % % 83   Post FEV1/FCV % % 84   FEV1-Pre L 3.13   FEV1-Predicted Pre % 102   FEV1-Post L 3.39   DLCO uncorrected ml/min/mmHg 23.50   DLCO UNC% % 79   DLVA Predicted % 90   TLC L 5.92   TLC % Predicted % 89   RV % Predicted % 93   2018: PFTs within normal limits.  Labs:  Path:  Echo 04/16/22: LV EF 68%. LV normal in size. Normal diastolic pattern. Trace aortic regurgitation. No evidence of pulmonary hypertension.  Heart Catheterization:  NM Cardiac Scan 04/22/22 Normal myocardial perfusion    Assessment & Plan:   Shortness of  breath  MDS (myelodysplastic syndrome), low grade  Discussion: Shawn Bentley is a 77 year old male, former smoker with history of GERD, myelodysplastic syndrome, sleep apnea and hereditary hemochromatosis who is referred to pulmonary clinic for shortness of breath and chest discomfort.   Patient feels his symptoms are related to stress/anxiety due to family stressors as these symptoms dissipated when he took a 1 week vacation away from his family.  I tend to agree with his assessment.  We will check pulmonary function test to further evaluate his shortness of breath in detail.  Will consider inhaler therapy in the future based on the breathing test and any ongoing symptoms.  Follow-up in 2 months.  Melody Comas, MD [facility] Office: [phone]    Current Outpatient Medications:    albuterol (VENTOLIN HFA) 108 (90 Base) MCG/ACT inhaler, Inhale 2 puffs into the lungs every 6 (six) hours as needed for wheezing., Disp: , Rfl:    cyclobenzaprine (FLEXERIL) 10 MG tablet, Take 10 mg by mouth 3 (three) times daily as needed for muscle spasms., Disp: , Rfl:    dutasteride (AVODART) 0.5 MG capsule, Take 0.5 mg by mouth at bedtime., Disp: , Rfl:    famotidine (PEPCID) 40 MG tablet, Take 40 mg by mouth 2 (two) times daily., Disp: , Rfl:    gabapentin (NEURONTIN) 300 MG capsule, Take 300 mg by mouth 2 (two) times daily., Disp: , Rfl:    glucosamine-chondroitin 500-400 MG tablet, Take 1 tablet by mouth 3 (three) times daily., Disp: , Rfl:    Mag Aspart-Potassium Aspart (POTASSIUM & MAGNESIUM ASPARTAT) 250-250 MG CAPS, Take by mouth., Disp: , Rfl:    magnesium (MAGTAB) 84 MG ( ) TBCR SR tablet, Take 84 mg by mouth daily., Disp: , Rfl:    naproxen sodium (ALEVE) 220 MG tablet, Take 220 mg by mouth., Disp: , Rfl:    pantoprazole (PROTONIX) 40 MG tablet, Take 40 mg by mouth 2 (two) times daily., Disp: , Rfl:    rOPINIRole (REQUIP) 1 MG tablet, Take 1 mg by mouth at  bedtime., Disp: , Rfl:    rosuvastatin (CRESTOR) 10 MG tablet, Take 1 tablet (10 mg total) by mouth daily., Disp: 90 tablet, Rfl: 3   tamsulosin (FLOMAX) 0.4 MG CAPS capsule, Take 0.4 mg by mouth at bedtime., Disp: , Rfl:    Turmeric 500 MG TABS, Take by mouth., Disp: , Rfl:

## 2022-06-25 ENCOUNTER — Ambulatory Visit (INDEPENDENT_AMBULATORY_CARE_PROVIDER_SITE_OTHER): Payer: Medicare HMO | Admitting: Pulmonary Disease

## 2022-06-25 DIAGNOSIS — R0602 Shortness of breath: Secondary | ICD-10-CM

## 2022-06-25 LAB — PULMONARY FUNCTION TEST
DL/VA % pred: 116 %
DL/VA: 4.66 ml/min/mmHg/L
DLCO cor % pred: 95 %
DLCO cor: 22.58 ml/min/mmHg
DLCO unc % pred: 95 %
DLCO unc: 22.58 ml/min/mmHg
FEF 25-75 Post: 3.62 L/sec
FEF 25-75 Pre: 2.92 L/sec
FEF2575-%Change-Post: 24 %
FEF2575-%Pred-Post: 173 %
FEF2575-%Pred-Pre: 139 %
FEV1-%Change-Post: 5 %
FEV1-%Pred-Post: 99 %
FEV1-%Pred-Pre: 93 %
FEV1-Post: 2.83 L
FEV1-Pre: 2.68 L
FEV1FVC-%Change-Post: 2 %
FEV1FVC-%Pred-Pre: 114 %
FEV6-%Change-Post: 3 %
FEV6-%Pred-Post: 90 %
FEV6-%Pred-Pre: 87 %
FEV6-Post: 3.33 L
FEV6-Pre: 3.22 L
FEV6FVC-%Pred-Post: 106 %
FEV6FVC-%Pred-Pre: 106 %
FVC-%Change-Post: 3 %
FVC-%Pred-Post: 84 %
FVC-%Pred-Pre: 81 %
FVC-Post: 3.33 L
FVC-Pre: 3.22 L
Post FEV1/FVC ratio: 85 %
Post FEV6/FVC ratio: 100 %
Pre FEV1/FVC ratio: 83 %
Pre FEV6/FVC Ratio: 100 %
RV % pred: 93 %
RV: 2.26 L
TLC % pred: 82 %
TLC: 5.48 L

## 2022-06-25 NOTE — Patient Instructions (Signed)
Full PFT Performed Today  

## 2022-06-25 NOTE — Progress Notes (Signed)
Full PFT Performed Today  

## 2022-07-04 ENCOUNTER — Telehealth: Payer: Self-pay | Admitting: Pulmonary Disease

## 2022-07-04 NOTE — Telephone Encounter (Signed)
Patient would like the doctor or nurse to call regarding his test results.  Please advise and call to discuss at [phone]

## 2022-07-04 NOTE — Telephone Encounter (Signed)
Dr. Dewald please advise on PFT results 

## 2022-07-05 ENCOUNTER — Encounter: Payer: Self-pay | Admitting: Pulmonary Disease

## 2022-07-05 NOTE — Telephone Encounter (Signed)
His PFT results are within normal limits. Message sent via myChart.  Thanks, JD

## 2022-07-07 NOTE — Telephone Encounter (Signed)
Went over PFT results with patient. Also scheduled 2 month f/u with Dr. Francine Graven. NFN

## 2022-08-14 ENCOUNTER — Ambulatory Visit (INDEPENDENT_AMBULATORY_CARE_PROVIDER_SITE_OTHER): Payer: Medicare HMO | Admitting: Pulmonary Disease

## 2022-08-14 ENCOUNTER — Encounter: Payer: Self-pay | Admitting: Pulmonary Disease

## 2022-08-14 VITALS — BP 118/62 | HR 58 | Ht 67.0 in | Wt 181.0 lb

## 2022-08-14 DIAGNOSIS — R0602 Shortness of breath: Secondary | ICD-10-CM

## 2022-08-14 NOTE — Progress Notes (Signed)
Synopsis: Referred in April 2024 for dyspnea by Ladora Daniel, PA  Subjective:   PATIENT ID: Shawn Bentley GENDER: male DOB: [birth year], MRN: [id number]  HPI  Chief Complaint  Patient presents with   Follow-up    2 mo f/u for SOB. States his breathing has stable since last visit.    Shawn Bentley is a 77 year old male, former smoker with history of GERD, myelodysplastic syndrome, sleep apnea and hereditary hemochromatosis who returns to pulmonary clinic for shortness of breath and chest discomfort.   Initial OV 06/16/22 Patient was evaluated in the [facility] for chest pain on 04/08/22. Patient seen at his PCP's office for upper respiratory tract infection in February 04/11/22 and was negative for RSV/Flu.   He has been having chest discomfort centrally and along the left chest for 2 months. He had NM Stress test and echo which are within normal limits.   He feels the symptoms could be related to stress from work and from his daughter who has MS and polysubstance abuse. He took a 1 week vacation to visit his niece in [place] with resolution of his chest discomfort and dyspnea during that week. Symptoms returned upon his return home.   He has Inspire device in place for OSA. He denies night time awakenings due to chest discomfort or dyspnea.   He works as a Product/process development scientist. He has 6 pack year smoking history, quit in 1973. 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 He lives with his wife and daughter. Grew up in [place]. Moved to [place] in [place]. He is taking food grade hydrogen peroxide, taking 21 drops per day.   Today - 08/14/22 He has been stable since last visit. No changes in his symptoms. His PFTs are within normal limits.   Past Medical History:  Diagnosis Date   Anemia    Arthritis    BPH (benign prostatic hyperplasia)    Dyspnea    with exertion   Former smoker    GERD (gastroesophageal reflux disease)    Hereditary  hemochromatosis (HCC)    MDS (myelodysplastic syndrome), low grade (HCC)    Sleep apnea    does not use CPAP     Family History  Problem Relation Age of Onset   Heart attack Father    Heart disease Father      Social History   Socioeconomic History   Marital status: Married    Spouse name: Not on file   Number of children: 3   Years of education: Not on file   Highest education level: Not on file  Occupational History   Not on file  Tobacco Use   Smoking status: Former    Packs/day: 1.50    Years: 4.00    Additional pack years: 0.00    Total pack years: 6.00    Types: Cigarettes    Quit date: 07/27/1972    Years since quitting: 50.0   Smokeless tobacco: Former    Quit date: 07/27/1972  Vaping Use   Vaping Use: Never used  Substance and Sexual Activity   Alcohol use: Yes    Comment: occais   Drug use: Never   Sexual activity: Yes  Other Topics Concern   Not on file  Social History Narrative   Not on file   Social Determinants of Health   Financial Resource Strain: Not on file  Food Insecurity: Not on file  Transportation Needs: Not on file  Physical Activity: Not on file  Stress: Not on file  Social Connections: Not on file  Intimate Partner Violence: Not on file     Allergies  Allergen Reactions   Pseudoephedrine-Acetaminophen Other (See Comments)    jittery   Sinus Congestion-Pain Daytime  [Phenylephrine-Acetaminophen] Anxiety    jittery   Sinus [Pseudoephedrine-Acetaminophen] Other (See Comments)    jittery   Meclizine Other (See Comments)    cloudy     Outpatient Medications Prior to Visit  Medication Sig Dispense Refill   dutasteride (AVODART) 0.5 MG capsule Take 0.5 mg by mouth at bedtime.     famotidine (PEPCID) 40 MG tablet Take 40 mg by mouth 2 (two) times daily.     gabapentin (NEURONTIN) 300 MG capsule Take 300 mg by mouth 2 (two) times daily.     Mag Aspart-Potassium Aspart (POTASSIUM & MAGNESIUM ASPARTAT) 250-250 MG CAPS Take by  mouth.     magnesium (MAGTAB) 84 MG ( ) TBCR SR tablet Take 84 mg by mouth daily.     naproxen sodium (ALEVE) 220 MG tablet Take 220 mg by mouth.     pantoprazole (PROTONIX) 40 MG tablet Take 40 mg by mouth 2 (two) times daily.     rOPINIRole (REQUIP) 1 MG tablet Take 1 mg by mouth at bedtime.     rosuvastatin (CRESTOR) 10 MG tablet Take 1 tablet (10 mg total) by mouth daily. 90 tablet 3   tamsulosin (FLOMAX) 0.4 MG CAPS capsule Take 0.4 mg by mouth at bedtime.     Turmeric 500 MG TABS Take by mouth.     No facility-administered medications prior to visit.   Review of Systems  Constitutional:  Negative for chills, fever, malaise/fatigue and weight loss.  HENT:  Negative for congestion, sinus pain and sore throat.   Eyes: Negative.   Respiratory:  Positive for shortness of breath. Negative for cough, hemoptysis, sputum production and wheezing.   Cardiovascular:  Positive for chest pain. Negative for palpitations, orthopnea, claudication and leg swelling.  Gastrointestinal:  Negative for abdominal pain, heartburn, nausea and vomiting.  Genitourinary: Negative.   Musculoskeletal:  Positive for joint pain. Negative for myalgias.  Skin:  Negative for rash.  Neurological:  Negative for weakness.  Endo/Heme/Allergies: Negative.   Psychiatric/Behavioral:  Positive for depression.    Objective:   Vitals:   08/14/22 0823  BP: 118/62  Pulse: (!) 58  SpO2: 100%  Weight: 181 lb (82.1 kg)  Height: 5\' 7"  (1.702 m)      Physical Exam Constitutional:      General: He is not in acute distress. HENT:     Head: Normocephalic and atraumatic.  Eyes:     Conjunctiva/sclera: Conjunctivae normal.  Cardiovascular:     Rate and Rhythm: Normal rate and regular rhythm.     Pulses: Normal pulses.     Heart sounds: Normal heart sounds. No murmur heard. Pulmonary:     Effort: Pulmonary effort is normal.     Breath sounds: Normal breath sounds. No wheezing, rhonchi or rales.  Musculoskeletal:      Right lower leg: No edema.     Left lower leg: No edema.  Skin:    General: Skin is warm and dry.  Neurological:     General: No focal deficit present.     Mental Status: He is alert.    CBC    Component Value Date/Time   WBC 5.4 04/08/2022 1250   RBC 3.15 (L) 04/08/2022 1250   HGB 9.7 (L) 04/08/2022  1250   HGB 12.0 (L) 05/30/2013 0759   HCT 29.2 (L) 04/08/2022 1250   HCT 36.7 (L) 05/30/2013 0759   PLT 117 (L) 04/08/2022 1250   PLT 165 05/30/2013 0759   MCV 92.7 04/08/2022 1250   MCV 94.6 05/30/2013 0759   MCH 30.8 04/08/2022 1250   MCHC 33.2 04/08/2022 1250   RDW 30.3 (H) 04/08/2022 1250   RDW 28.9 (H) 05/30/2013 0759   LYMPHSABS 1.8 05/30/2013 0759   MONOABS 0.6 05/30/2013 0759   EOSABS 0.2 05/30/2013 0759   BASOSABS 0.1 05/30/2013 0759      Latest Ref Rng & Units 04/08/2022   12:50 PM 05/30/2013    7:59 AM 07/27/2012   10:35 AM  BMP  Glucose 70 - 99 mg/dL 98  96  95   BUN 8 - 23 mg/dL 16  91.4  78.2   Creatinine 0.61 - 1.24 mg/dL 9.56  1.0  0.9   Sodium 135 - 145 mmol/L 139  139  141   Potassium 3.5 - 5.1 mmol/L 3.7  4.5  4.4   Chloride 98 - 111 mmol/L 106   108   CO2 22 - 32 mmol/L 26  23  25    Calcium 8.9 - 10.3 mg/dL 9.6  9.3  9.2    Chest imaging: CXR 04/08/22 Stable appearance of nerve stimulator generator in the right chest wall with lead extending into the right neck. The heart size and mediastinal contours are within normal limits. There is no evidence of pulmonary edema, consolidation, pneumothorax, nodule or pleural fluid. The visualized skeletal structures are unremarkable.  CT Chest 08/20/21 1.  No acute findings appreciated within the chest.  2.  Mild diffuse colonic wall thickening concerning for nonspecific colitis. No bowel obstruction.  3.  Appendix not visualized.  4.  Suspect mild hepatic steatosis.  5.  Similar splenomegaly.  6.  Bilateral nephrolithiasis without urinary obstruction.  7.  Right renal cysts.  8.  Mild diffuse bladder  wall thickening is nonspecific. Please correlate with urinalysis for potential cystitis.  9.  Remote surgery for right inguinal hernia repair.  10.  Small fat-containing left inguinal hernia.  11.  Mild atherosclerosis, including coronary artery calcifications. No aortic aneurysm.   PFT:    Latest Ref Rng & Units 06/25/2022    2:06 PM 06/06/2016    3:35 PM  PFT Results  FVC-Pre L 3.22  3.76   FVC-Predicted Pre % 81  90   FVC-Post L 3.33  4.02   FVC-Predicted Post % 84  97   Pre FEV1/FVC % % 83  83   Post FEV1/FCV % % 85  84   FEV1-Pre L 2.68  3.13   FEV1-Predicted Pre % 93  102   FEV1-Post L 2.83  3.39   DLCO uncorrected ml/min/mmHg 22.58  23.50   DLCO UNC% % 95  79   DLCO corrected ml/min/mmHg 22.58    DLCO COR %Predicted % 95    DLVA Predicted % 116  90   TLC L 5.48  5.92   TLC % Predicted % 82  89   RV % Predicted % 93  93   2018: PFTs within normal limits. 2024: PFTs within normal limits  Labs:  Path:  Echo 04/16/22: LV EF 68%. LV normal in size. Normal diastolic pattern. Trace aortic regurgitation. No evidence of pulmonary hypertension.  Heart Catheterization:  NM Cardiac Scan 04/22/22 Normal myocardial perfusion    Assessment & Plan:   Shortness of breath  Discussion: Shawn Bentley is a 77 year old male, former smoker with history of GERD, myelodysplastic syndrome, sleep apnea and hereditary hemochromatosis who returns to pulmonary clinic for shortness of breath and chest discomfort.   Patient feels his symptoms are related to stress/anxiety due to family stressors as these symptoms dissipated when he took a 1 week vacation away from his family.  I tend to agree with his assessment.  Repeat pulmonary function tests are within normal limits. He has had slight decline in FEV1 and FVC compared to 2018.   He is not interested in inhaler trial at this time. Encouraged working on a daily walking regimen.  Follow up as needed.   Melody Comas, MD [facility] Office: [phone]    Current Outpatient Medications:    dutasteride (AVODART) 0.5 MG capsule, Take 0.5 mg by mouth at bedtime., Disp: , Rfl:    famotidine (PEPCID) 40 MG tablet, Take 40 mg by mouth 2 (two) times daily., Disp: , Rfl:    gabapentin (NEURONTIN) 300 MG capsule, Take 300 mg by mouth 2 (two) times daily., Disp: , Rfl:    Mag Aspart-Potassium Aspart (POTASSIUM & MAGNESIUM ASPARTAT) 250-250 MG CAPS, Take by mouth., Disp: , Rfl:    magnesium (MAGTAB) 84 MG ( ) TBCR SR tablet, Take 84 mg by mouth daily., Disp: , Rfl:    naproxen sodium (ALEVE) 220 MG tablet, Take 220 mg by mouth., Disp: , Rfl:    pantoprazole (PROTONIX) 40 MG tablet, Take 40 mg by mouth 2 (two) times daily., Disp: , Rfl:    rOPINIRole (REQUIP) 1 MG tablet, Take 1 mg by mouth at bedtime., Disp: , Rfl:    rosuvastatin (CRESTOR) 10 MG tablet, Take 1 tablet (10 mg total) by mouth daily., Disp: 90 tablet, Rfl: 3   tamsulosin (FLOMAX) 0.4 MG CAPS capsule, Take 0.4 mg by mouth at bedtime., Disp: , Rfl:    Turmeric 500 MG TABS, Take by mouth., Disp: , Rfl:

## 2022-08-14 NOTE — Patient Instructions (Addendum)
Your breathing tests remain within normal limits with some decline from a few years ago.   Follow up as needed if your breathing symptoms change

## 2022-11-11 ENCOUNTER — Emergency Department (HOSPITAL_BASED_OUTPATIENT_CLINIC_OR_DEPARTMENT_OTHER)
Admission: EM | Admit: 2022-11-11 | Discharge: 2022-11-11 | Disposition: A | Discharge status: Home / Self Care | Payer: Medicare HMO | Attending: Emergency Medicine | Admitting: Emergency Medicine

## 2022-11-11 ENCOUNTER — Emergency Department (HOSPITAL_BASED_OUTPATIENT_CLINIC_OR_DEPARTMENT_OTHER): Payer: Medicare HMO

## 2022-11-11 ENCOUNTER — Encounter (HOSPITAL_BASED_OUTPATIENT_CLINIC_OR_DEPARTMENT_OTHER): Payer: Self-pay | Admitting: Emergency Medicine

## 2022-11-11 ENCOUNTER — Other Ambulatory Visit (HOSPITAL_BASED_OUTPATIENT_CLINIC_OR_DEPARTMENT_OTHER): Payer: Self-pay

## 2022-11-11 ENCOUNTER — Other Ambulatory Visit: Payer: Self-pay

## 2022-11-11 DIAGNOSIS — R42 Dizziness and giddiness: Secondary | ICD-10-CM | POA: Diagnosis present

## 2022-11-11 LAB — CBC WITH DIFFERENTIAL/PLATELET
Abs Immature Granulocytes: 0.11 10*3/uL — ABNORMAL HIGH (ref 0.00–0.07)
Basophils Absolute: 0 10*3/uL (ref 0.0–0.1)
Basophils Relative: 1 %
Eosinophils Absolute: 0.2 10*3/uL (ref 0.0–0.5)
Eosinophils Relative: 3 %
HCT: 28.7 % — ABNORMAL LOW (ref 39.0–52.0)
Hemoglobin: 9.8 g/dL — ABNORMAL LOW (ref 13.0–17.0)
Immature Granulocytes: 2 %
Lymphocytes Relative: 30 %
Lymphs Abs: 1.6 10*3/uL (ref 0.7–4.0)
MCH: 31.5 pg (ref 26.0–34.0)
MCHC: 34.1 g/dL (ref 30.0–36.0)
MCV: 92.3 fL (ref 80.0–100.0)
Monocytes Absolute: 0.6 10*3/uL (ref 0.1–1.0)
Monocytes Relative: 10 %
Neutro Abs: 3 10*3/uL (ref 1.7–7.7)
Neutrophils Relative %: 54 %
Platelets: 125 10*3/uL — ABNORMAL LOW (ref 150–400)
RBC: 3.11 MIL/uL — ABNORMAL LOW (ref 4.22–5.81)
RDW: 29.4 % — ABNORMAL HIGH (ref 11.5–15.5)
WBC: 5.5 10*3/uL (ref 4.0–10.5)
nRBC: 1.8 % — ABNORMAL HIGH (ref 0.0–0.2)

## 2022-11-11 LAB — BASIC METABOLIC PANEL
Anion gap: 7 (ref 5–15)
BUN: 18 mg/dL (ref 8–23)
CO2: 26 mmol/L (ref 22–32)
Calcium: 9.1 mg/dL (ref 8.9–10.3)
Chloride: 107 mmol/L (ref 98–111)
Creatinine, Ser: 1.08 mg/dL (ref 0.61–1.24)
GFR, Estimated: >60 mL/min (ref >60)
Glucose, Bld: 87 mg/dL (ref 70–99)
Potassium: 4.2 mmol/L (ref 3.5–5.1)
Sodium: 140 mmol/L (ref 135–145)

## 2022-11-11 MED ORDER — SCOPOLAMINE 1 MG/3DAYS TD PT72
1.0000 | MEDICATED_PATCH | TRANSDERMAL | 12 refills | Status: DC | PRN
  Filled 2022-11-11: qty 10, 30d supply, fill #0

## 2022-11-11 MED ORDER — SCOPOLAMINE 1 MG/3DAYS TD PT72: 1.0000 | MEDICATED_PATCH | TRANSDERMAL | 12 refills | Status: AC | PRN

## 2022-11-11 MED ORDER — IOHEXOL 350 MG/ML SOLN
100.0000 mL | Freq: Once | INTRAVENOUS | Status: AC | PRN
  Administered 2022-11-11: 75 mL via INTRAVENOUS

## 2022-11-11 NOTE — Discharge Instructions (Addendum)
The blood work and the CAT scans today did not show any new findings that would Splane your dizziness.  However an MRI would completely rule out a stroke.  Make sure you follow-up with your doctor to get this ordered.  You may also benefit from physical therapy if the dizziness continues.  You can try the scopolamine patch to see if that helps with the dizziness.  If the patch is not helpful or gives you unwanted side effects discontinue.

## 2022-11-11 NOTE — ED Provider Notes (Signed)
[place] EMERGENCY DEPARTMENT AT [facility] Provider Note   CSN: [id number] Arrival date & time: 11/11/22  4098     History  Chief Complaint  Patient presents with   Dizziness    Shawn Bentley is a 77 y.o. male.  Patient is a 77 year old male with a history of anemia, myelodysplastic syndrome, hereditary hemochromatosis who is presenting today with a 6 weeks history of progressive vertigo.  Initially 6 weeks ago he had just noticed it when he would lay down at night or if he would stand but it would quickly resolve and felt like a spinning disequilibrium sensation.  However in the last 4 weeks its become progressive and now he reports feeling it almost all the time.  His wife reports that he even looks a little bit unsteady when he walks and almost tripped over the dog the other day when he was trying to step over him almost falling.  Patient reports looking up seems to worsen his symptoms.  He has not had any significant headache or neck pain but notices popping in his neck.  He also denies any unilateral numbness, weakness, facial droop, difficulty speaking but his wife reports over the last 6 months he has had progressive forgetfulness.  No recent medication changes or illnesses.  He does report a history of vertigo in the past but is never acted like this before usually is gone within 3 to 5 days and this is definitely different.  The history is provided by the patient.  Dizziness      Home Medications Prior to Admission medications   Medication Sig Start Date End Date Taking? Authorizing Provider  scopolamine (TRANSDERM-SCOP) 1 MG/3DAYS Place 1 patch (1.5 mg total) onto the skin every 3 (three) days as needed (for dizziness). 11/11/22  Yes Liller Yohn, Alphonzo Lemmings, MD  dutasteride (AVODART) 0.5 MG capsule Take 0.5 mg by mouth at bedtime.    [provider]  famotidine (PEPCID) 40 MG tablet Take 40 mg by mouth 2 (two) times daily.    [provider]   gabapentin (NEURONTIN) 300 MG capsule Take 300 mg by mouth 2 (two) times daily.    [provider]  Mag Aspart-Potassium Aspart (POTASSIUM & MAGNESIUM ASPARTAT) 250-250 MG CAPS Take by mouth.    [provider]  magnesium (MAGTAB) 84 MG ( ) TBCR SR tablet Take 84 mg by mouth daily.    [provider]  meloxicam (MOBIC) 15 MG tablet Take 15 mg by mouth daily.    [provider]  naproxen sodium (ALEVE) 220 MG tablet Take 220 mg by mouth.    [provider]  pantoprazole (PROTONIX) 40 MG tablet Take 40 mg by mouth 2 (two) times daily.    [provider]  rOPINIRole (REQUIP) 1 MG tablet Take 1 mg by mouth at bedtime.    [provider]  rosuvastatin (CRESTOR) 10 MG tablet Take 1 tablet (10 mg total) by mouth daily. 06/06/22   Patwardhan, Anabel Bene, MD  tamsulosin (FLOMAX) 0.4 MG CAPS capsule Take 0.4 mg by mouth at bedtime.    [provider]  Turmeric 500 MG TABS Take by mouth.    [provider]      Allergies    Pseudoephedrine-acetaminophen, Sinus congestion-pain daytime  [phenylephrine-acetaminophen], Sinus [pseudoephedrine-acetaminophen], and Meclizine    Review of Systems   Review of Systems  Neurological:  Positive for dizziness.    Physical Exam Updated Vital Signs BP (!) 115/53   Pulse (!) 52  Temp 98.6 F (37 C) (Oral)   Resp 16   Ht 5\' 7"  (1.702 m)   Wt 82.1 kg   SpO2 99%   BMI 28.35 kg/m  Physical Exam Vitals and nursing note reviewed.  Constitutional:      General: He is not in acute distress.    Appearance: He is well-developed.  HENT:     Head: Normocephalic and atraumatic.     Right Ear: Tympanic membrane normal.     Left Ear: Tympanic membrane normal.  Eyes:     General: No visual field deficit.    Extraocular Movements: Extraocular movements intact.     Conjunctiva/sclera: Conjunctivae normal.     Pupils: Pupils are equal, round, and reactive to light.     Comments: Mild  nystagmus when looking up but extraocular movements are intact  Cardiovascular:     Rate and Rhythm: Normal rate and regular rhythm.     Heart sounds: No murmur heard. Pulmonary:     Effort: Pulmonary effort is normal. No respiratory distress.     Breath sounds: Normal breath sounds. No wheezing or rales.  Abdominal:     General: There is no distension.     Palpations: Abdomen is soft.     Tenderness: There is no abdominal tenderness. There is no guarding or rebound.  Musculoskeletal:        General: No tenderness. Normal range of motion.     Cervical back: Normal range of motion and neck supple. No tenderness. No spinous process tenderness or muscular tenderness.  Skin:    General: Skin is warm and dry.     Findings: No erythema or rash.  Neurological:     Mental Status: He is alert and oriented to person, place, and time. Mental status is at baseline.     Cranial Nerves: No facial asymmetry.     Sensory: Sensation is intact.     Motor: Motor function is intact.     Coordination: Romberg sign negative. Coordination normal. Finger-Nose-Finger Test and Heel to [facility] Test normal.     Gait: Gait is intact. Gait normal.  Psychiatric:        Behavior: Behavior normal.     ED Results / Procedures / Treatments   Labs (all labs ordered are listed, but only abnormal results are displayed) Labs Reviewed  CBC WITH DIFFERENTIAL/PLATELET - Abnormal; Notable for the following components:      Result Value   RBC 3.11 (*)    Hemoglobin 9.8 (*)    HCT 28.7 (*)    RDW 29.4 (*)    Platelets 125 (*)    nRBC 1.8 (*)    Abs Immature Granulocytes 0.11 (*)    All other components within normal limits  BASIC METABOLIC PANEL    EKG EKG Interpretation Date/Time:  Tuesday November 11 2022 09:42:50 EDT Ventricular Rate:  55 PR Interval:  156 QRS Duration:  95 QT Interval:  421 QTC Calculation: 403 R Axis:   5  Text Interpretation: Sinus rhythm RSR' in V1 or V2, right VCD or RVH No  significant change since last tracing Confirmed by Gwyneth Sprout ([id number]) on 11/11/2022 10:11:50 AM  Radiology CT ANGIO HEAD NECK W WO CM  Result Date: 11/11/2022 CLINICAL DATA:  Vertigo, central EXAM: CT ANGIOGRAPHY HEAD AND NECK WITH AND WITHOUT CONTRAST TECHNIQUE: Multidetector CT imaging of the head and neck was performed using the standard protocol during bolus administration of intravenous contrast. Multiplanar CT image reconstructions and MIPs were obtained to  evaluate the vascular anatomy. Carotid stenosis measurements (when applicable) are obtained utilizing NASCET criteria, using the distal internal carotid diameter as the denominator. RADIATION DOSE REDUCTION: This exam was performed according to the departmental dose-optimization program which includes automated exposure control, adjustment of the mA and/or kV according to patient size and/or use of iterative reconstruction technique. CONTRAST:  75mL OMNIPAQUE IOHEXOL 350 MG/ML SOLN COMPARISON:  None Available. FINDINGS: CT HEAD FINDINGS Brain: No evidence of acute infarction, hemorrhage, hydrocephalus, extra-axial collection or mass lesion/mass effect. Mild chronic microvascular ischemic change. Vascular: No hyperdense vessel or unexpected calcification. Skull: Normal. Negative for fracture or focal lesion. Sinuses/Orbits: No middle ear or mastoid effusion. Paranasal sinuses are clear. Orbits are unremarkable. Other: None. Review of the MIP images confirms the above findings CTA NECK FINDINGS Aortic arch: Standard branching. Imaged portion shows no evidence of aneurysm or dissection. No significant stenosis of the major arch vessel origins. Right carotid system: No evidence of dissection, stenosis (50% or greater), or occlusion. There is irregular and beaded appearance of the cervical ICA, which can be seen in the setting of FMD. Left carotid system: No evidence of dissection, stenosis (50% or greater), or occlusion.There is irregular and beaded  appearance of the cervical ICA, which can be seen in the setting of FMD. Vertebral arteries: Codominant. No evidence of dissection, stenosis (50% or greater), or occlusion. Skeleton: Negative. Other neck: Negative. Upper chest: Negative. Review of the MIP images confirms the above findings CTA HEAD FINDINGS Anterior circulation: No significant stenosis, proximal occlusion, aneurysm, or vascular malformation. Posterior circulation: No significant stenosis, proximal occlusion, aneurysm, or vascular malformation. Venous sinuses: As permitted by contrast timing, patent. Anatomic variants: None Review of the MIP images confirms the above findings IMPRESSION: 1. No acute intracranial abnormality. 2. No intracranial large vessel occlusion or significant stenosis. 3. No hemodynamically significant stenosis in the neck. 4. Irregular and beaded appearance of the cervical ICAs, which can be seen in the setting of FMD. Electronically Signed   By: Lorenza Cambridge M.D.   On: 11/11/2022 13:53    Procedures Procedures    Medications Ordered in ED Medications  iohexol (OMNIPAQUE) 350 MG/ML injection 100 mL (75 mLs Intravenous Contrast Given 11/11/22 1130)    ED Course/ Medical Decision Making/ A&P                                 Medical Decision Making Amount and/or Complexity of Data Reviewed Labs: ordered. Decision-making details documented in ED Course. Radiology: ordered and independent interpretation performed. Decision-making details documented in ED Course.  Risk Prescription drug management.   Pt with multiple medical problems and comorbidities and presenting today with a complaint that caries a high risk for morbidity and mortality.  Here today with vertiginous symptoms.  Concern for BPV versus central vertigo caused by mass, stroke or vertebral dissection.  Lower suspicion for labyrinthitis or viral etiologies.  Symptoms are not classic for anemia, cardiac etiologies.  I independently interpreted  patient's EKG and labs.  EKG without acute findings.  2:10 PM I independently interpreted patient's labs and CBC without acute changes with hemoglobin in the mid 9 range which is stable compared with prior, BMP without acute findings. I have independently visualized and interpreted pt's images today. CT of the head and neck without evidence of new masses or bleeding.  Radiology reports no acute intercranial abnormalities large vessel occlusion or significant stenosis in the head or the neck.  Irregular and beaded appearance of the cervical ICAs which can be in the setting of fibromuscular dysplasia.  Patient is aware of the ICA narrowing and is having that followed.  Discussed with the patient and his wife MRI to rule out stroke definitively.  Since symptoms have been going on 6 weeks gave him the option of going to [facility] today and having the imaging of her symptoms following up with his PCP.  Patient wanted to follow-up with his PCP and have the MRI ordered.  Will try scopolamine to help with the dizziness in the meantime.  Encourage patient to not use ladders or things where he could fall.  He has tried meclizine before but reports it causes dizziness.         Final Clinical Impression(s) / ED Diagnoses Final diagnoses:  Vertigo    Rx / DC Orders ED Discharge Orders          Ordered    scopolamine (TRANSDERM-SCOP) 1 MG/3DAYS  Every 72 hours PRN        11/11/22 1407              Gwyneth Sprout, MD 11/11/22 1410

## 2022-11-11 NOTE — ED Notes (Signed)
Discharge paperwork given and verbally understood. 

## 2022-11-11 NOTE — ED Triage Notes (Signed)
Pt arrives to ED with c/o intermittent dizziness x6 weeks. Pt notes dizziness comes when laying down, looking up, or standing up. He also notes dizziness when climbing ladder this morning.

## 2023-03-27 ENCOUNTER — Other Ambulatory Visit: Payer: Self-pay | Admitting: Cardiology

## 2023-04-17 ENCOUNTER — Other Ambulatory Visit: Payer: Self-pay

## 2023-04-17 ENCOUNTER — Emergency Department (HOSPITAL_BASED_OUTPATIENT_CLINIC_OR_DEPARTMENT_OTHER)
Admission: EM | Admit: 2023-04-17 | Discharge: 2023-04-17 | Disposition: A | Discharge status: Home / Self Care | Payer: Medicare HMO | Attending: Emergency Medicine | Admitting: Emergency Medicine

## 2023-04-17 ENCOUNTER — Other Ambulatory Visit (HOSPITAL_BASED_OUTPATIENT_CLINIC_OR_DEPARTMENT_OTHER): Payer: Self-pay

## 2023-04-17 ENCOUNTER — Encounter (HOSPITAL_BASED_OUTPATIENT_CLINIC_OR_DEPARTMENT_OTHER): Payer: Self-pay | Admitting: Emergency Medicine

## 2023-04-17 ENCOUNTER — Emergency Department (HOSPITAL_BASED_OUTPATIENT_CLINIC_OR_DEPARTMENT_OTHER): Payer: Medicare HMO

## 2023-04-17 DIAGNOSIS — R262 Difficulty in walking, not elsewhere classified: Secondary | ICD-10-CM | POA: Insufficient documentation

## 2023-04-17 DIAGNOSIS — R42 Dizziness and giddiness: Secondary | ICD-10-CM | POA: Diagnosis present

## 2023-04-17 LAB — COMPREHENSIVE METABOLIC PANEL
ALT: 29 U/L (ref 0–44)
AST: 24 U/L (ref 15–41)
Albumin: 4.7 g/dL (ref 3.5–5.0)
Alkaline Phosphatase: 48 U/L (ref 38–126)
Anion gap: 6 (ref 5–15)
BUN: 20 mg/dL (ref 8–23)
CO2: 28 mmol/L (ref 22–32)
Calcium: 9.8 mg/dL (ref 8.9–10.3)
Chloride: 107 mmol/L (ref 98–111)
Creatinine, Ser: 1.07 mg/dL (ref 0.61–1.24)
GFR, Estimated: >60 mL/min (ref >60)
Glucose, Bld: 86 mg/dL (ref 70–99)
Potassium: 4.2 mmol/L (ref 3.5–5.1)
Sodium: 141 mmol/L (ref 135–145)
Total Bilirubin: 1.7 mg/dL — ABNORMAL HIGH (ref 0.0–1.2)
Total Protein: 6.7 g/dL (ref 6.5–8.1)

## 2023-04-17 LAB — CBC WITH DIFFERENTIAL/PLATELET
Abs Immature Granulocytes: 0.14 10*3/uL — ABNORMAL HIGH (ref 0.00–0.07)
Basophils Absolute: 0 10*3/uL (ref 0.0–0.1)
Basophils Relative: 1 %
Eosinophils Absolute: 0.2 10*3/uL (ref 0.0–0.5)
Eosinophils Relative: 3 %
HCT: 30.7 % — ABNORMAL LOW (ref 39.0–52.0)
Hemoglobin: 10.4 g/dL — ABNORMAL LOW (ref 13.0–17.0)
Immature Granulocytes: 3 %
Lymphocytes Relative: 30 %
Lymphs Abs: 1.7 10*3/uL (ref 0.7–4.0)
MCH: 32.6 pg (ref 26.0–34.0)
MCHC: 33.9 g/dL (ref 30.0–36.0)
MCV: 96.2 fL (ref 80.0–100.0)
Monocytes Absolute: 0.5 10*3/uL (ref 0.1–1.0)
Monocytes Relative: 8 %
Neutro Abs: 3 10*3/uL (ref 1.7–7.7)
Neutrophils Relative %: 55 %
Platelets: 122 10*3/uL — ABNORMAL LOW (ref 150–400)
RBC: 3.19 MIL/uL — ABNORMAL LOW (ref 4.22–5.81)
RDW: 30.1 % — ABNORMAL HIGH (ref 11.5–15.5)
WBC: 5.6 10*3/uL (ref 4.0–10.5)
nRBC: 2 % — ABNORMAL HIGH (ref 0.0–0.2)

## 2023-04-17 MED ORDER — IOHEXOL 350 MG/ML SOLN
75.0000 mL | Freq: Once | INTRAVENOUS | Status: AC | PRN
  Administered 2023-04-17: 75 mL via INTRAVENOUS

## 2023-04-17 NOTE — Discharge Instructions (Signed)
If you develop recurrent dizziness, inability to walk, or if your symptoms do not go away, or if you develop headache, weakness or numbness, or any other new/concerning symptoms then return to the ER or call 911.

## 2023-04-17 NOTE — ED Provider Notes (Signed)
[place] EMERGENCY DEPARTMENT AT [facility] Provider Note   CSN: [id number] Arrival date & time: 04/17/23  0757     History  Chief Complaint  Patient presents with   Dizziness    Shawn Bentley is a 78 y.o. male.  HPI 78 year old male presents with dizziness and difficulty walking.  For the past week when he first wakes up and gets out of bed he will stumble and have difficulty walking and has to hold onto the wall.  It eventually goes away and then he is fine the rest of the day, including with certain position changes he will not develop recurrent symptoms.  When it for started a week ago it was about 5 minutes in length and then has progressively lengthened and this morning it took about 3 hours before it finally resolved when he got to the emergency department.  Has not had any headache, vision changes or new ear ringing/ear pain.  No focal weakness or numbness besides his chronic hand numbness.  Earlier this week he took a couple extra doses of gabapentin after talking to his doctor due to the work he was doing but has not increased the dose in the last couple days.  Right now he is asymptomatic.  He has been having some left-sided neck and trapezius tightness for a couple weeks that was a little more bothersome yesterday.  BP was 142/57 this morning, states he tends to run low on the diastolic number.  Home Medications Prior to Admission medications   Medication Sig Start Date End Date Taking? Authorizing Provider  dutasteride (AVODART) 0.5 MG capsule Take 0.5 mg by mouth at bedtime.    [provider]  famotidine (PEPCID) 40 MG tablet Take 40 mg by mouth 2 (two) times daily.    [provider]  gabapentin (NEURONTIN) 300 MG capsule Take 300 mg by mouth 2 (two) times daily.    [provider]  Mag Aspart-Potassium Aspart (POTASSIUM & MAGNESIUM ASPARTAT) 250-250 MG CAPS Take by mouth.    [provider]  magnesium (MAGTAB) 84 MG  ( ) TBCR SR tablet Take 84 mg by mouth daily.    [provider]  meloxicam (MOBIC) 15 MG tablet Take 15 mg by mouth daily.    [provider]  naproxen sodium (ALEVE) 220 MG tablet Take 220 mg by mouth.    [provider]  pantoprazole (PROTONIX) 40 MG tablet Take 40 mg by mouth 2 (two) times daily.    [provider]  rOPINIRole (REQUIP) 1 MG tablet Take 1 mg by mouth at bedtime.    [provider]  rosuvastatin (CRESTOR) 10 MG tablet TAKE 1 TABLET EVERY DAY 03/27/23   Patwardhan, Manish J, MD  scopolamine (TRANSDERM-SCOP) 1 MG/3DAYS Place 1 patch (1.5 mg total) onto the skin every 3 (three) days as needed (for dizziness). 11/11/22   Gwyneth Sprout, MD  tamsulosin (FLOMAX) 0.4 MG CAPS capsule Take 0.4 mg by mouth at bedtime.    [provider]  Turmeric 500 MG TABS Take by mouth.    [provider]      Allergies    Pseudoephedrine-acetaminophen, Sinus congestion-pain daytime  [phenylephrine-acetaminophen], Sinus [pseudoephedrine-acetaminophen], and Meclizine    Review of Systems   Review of Systems  Constitutional:  Negative for fever.  Respiratory:  Negative for shortness of breath.   Cardiovascular:  Negative for chest pain.  Gastrointestinal:  Negative for vomiting.  Musculoskeletal:  Positive for gait problem.  Neurological:  Positive for  dizziness. Negative for weakness and headaches.    Physical Exam Updated Vital Signs BP 129/64   Pulse (!) 48   Temp 97.7 F (36.5 C) (Oral)   Resp 13   SpO2 98%  Physical Exam Vitals and nursing note reviewed.  Constitutional:      General: He is not in acute distress.    Appearance: He is well-developed. He is not ill-appearing or diaphoretic.  HENT:     Head: Normocephalic and atraumatic.     Right Ear: Tympanic membrane normal.     Left Ear: Tympanic membrane normal.  Eyes:     Extraocular Movements: Extraocular movements intact.     Pupils: Pupils are equal,  round, and reactive to light.  Cardiovascular:     Rate and Rhythm: Normal rate and regular rhythm.     Heart sounds: Normal heart sounds.  Pulmonary:     Effort: Pulmonary effort is normal.     Breath sounds: Normal breath sounds.  Abdominal:     Palpations: Abdomen is soft.     Tenderness: There is no abdominal tenderness.  Skin:    General: Skin is warm and dry.  Neurological:     Mental Status: He is alert.     Comments: CN 3-12 grossly intact. 5/5 strength in all 4 extremities. Grossly normal sensation. Normal finger to nose. Normal gait.     ED Results / Procedures / Treatments   Labs (all labs ordered are listed, but only abnormal results are displayed) Labs Reviewed  COMPREHENSIVE METABOLIC PANEL - Abnormal; Notable for the following components:      Result Value   Total Bilirubin 1.7 (*)    All other components within normal limits  CBC WITH DIFFERENTIAL/PLATELET - Abnormal; Notable for the following components:   RBC 3.19 (*)    Hemoglobin 10.4 (*)    HCT 30.7 (*)    RDW 30.1 (*)    Platelets 122 (*)    nRBC 2.0 (*)    Abs Immature Granulocytes 0.14 (*)    All other components within normal limits    EKG EKG Interpretation Date/Time:  Friday April 17 2023 08:57:25 EST Ventricular Rate:  53 PR Interval:  150 QRS Duration:  98 QT Interval:  437 QTC Calculation: 411 R Axis:   8  Text Interpretation: Sinus rhythm Abnormal R-wave progression, early transition no significant change since 2024 Confirmed by Pricilla Loveless [phone]) on 04/17/2023 10:11:42 AM  Radiology CT ANGIO HEAD NECK W WO CM Result Date: 04/17/2023 CLINICAL DATA:  Vertebral artery dissection suspected EXAM: CT ANGIOGRAPHY HEAD AND NECK WITH AND WITHOUT CONTRAST TECHNIQUE: Multidetector CT imaging of the head and neck was performed using the standard protocol during bolus administration of intravenous contrast. Multiplanar CT image reconstructions and MIPs were obtained to evaluate the vascular  anatomy. Carotid stenosis measurements (when applicable) are obtained utilizing NASCET criteria, using the distal internal carotid diameter as the denominator. RADIATION DOSE REDUCTION: This exam was performed according to the departmental dose-optimization program which includes automated exposure control, adjustment of the mA and/or kV according to patient size and/or use of iterative reconstruction technique. CONTRAST:  75mL OMNIPAQUE IOHEXOL 350 MG/ML SOLN COMPARISON:  CTA head and neck angiogram 11/11/2022 FINDINGS: CT HEAD FINDINGS Brain: No evidence of acute infarction, hemorrhage, hydrocephalus, extra-axial collection or mass lesion/mass effect. Vascular: No hyperdense vessel or unexpected calcification. Skull: Normal. Negative for fracture or focal lesion. Sinuses/Orbits: No middle ear or mastoid effusion. Paranasal sinuses are clear. Orbits are unremarkable. Other: None.  Review of the MIP images confirms the above findings CTA NECK FINDINGS Aortic arch: Standard branching. Imaged portion shows no evidence of aneurysm or dissection. No significant stenosis of the major arch vessel origins. Right carotid system: No evidence of dissection, stenosis (50% or greater), or occlusion. Beaded and irregular appearance of the right cervical ICA, as can be seen in the setting of FMD. Left carotid system: No evidence of dissection, stenosis (50% or greater), or occlusion. Beaded and irregular appearance of the left cervical ICA, as can be seen in the setting of FMD. Vertebral arteries: Codominant. No evidence of dissection, stenosis (50% or greater), or occlusion. Mild to moderate narrowing of the origin of the right vertebral artery secondary to calcified atherosclerotic plaque. Skeleton: Negative. Other neck: Hypoglossal nerve stimulator in place. Upper chest: Negative. Review of the MIP images confirms the above findings CTA HEAD FINDINGS Anterior circulation: No significant stenosis, proximal occlusion, aneurysm,  or vascular malformation. Posterior circulation: No significant stenosis, proximal occlusion, aneurysm, or vascular malformation. Venous sinuses: As permitted by contrast timing, patent. Anatomic variants: None Review of the MIP images confirms the above findings IMPRESSION: 1. No acute intracranial abnormality. 2. No intracranial large vessel occlusion or significant stenosis. 3. No hemodynamically significant stenosis in the neck. 4. Beaded and irregular appearance of the cervical ICAs, as can be seen in the setting of FMD. Electronically Signed   By: Lorenza Cambridge M.D.   On: 04/17/2023 10:47    Procedures Procedures    Medications Ordered in ED Medications  iohexol (OMNIPAQUE) 350 MG/ML injection 75 mL (75 mLs Intravenous Contrast Given 04/17/23 1009)    ED Course/ Medical Decision Making/ A&P                                 Medical Decision Making Amount and/or Complexity of Data Reviewed Labs: ordered.    Details: Chronic anemia and thrombocytopenia Radiology: ordered and independent interpretation performed.    Details: No head bleed/mass ECG/medicine tests: ordered and independent interpretation performed.    Details: No ischemia  Risk Prescription drug management.   Patient presents with dizziness that only occurs in the morning.  It has been lasting longer but otherwise seems to only occur first thing in the morning.  Neuroexam here is unremarkable and he has no troubles with ambulating.  He is essentially asymptomatic currently.  Mild bradycardia but his vitals are otherwise unremarkable.  Labs are unchanged from baseline and CTA shows no dissection or obvious CNS pathology such as head bleed or obvious mass.  Given this is a recurrent symptom each day and only in the morning, my suspicion that he is developing recurrent strokes/TIAs is quite low.  I do not think an MRI is needed emergently.  Especially since he is asymptomatic.  Patient otherwise is planning to check his  blood pressure at home when these events occur but at this point I think he is stable for discharge home with outpatient PCP follow-up.  No other concerning findings on history/workup such as vision changes, focal neurodeficits, infectious symptoms, etc.        Final Clinical Impression(s) / ED Diagnoses Final diagnoses:  Dizziness    Rx / DC Orders ED Discharge Orders     None         Pricilla Loveless, MD 04/17/23 1408

## 2023-04-17 NOTE — ED Triage Notes (Incomplete)
Pt caox4, ambulatory c/o dizziness with unsteady gait that he has woken up with every morning for the past week.

## 2023-04-21 ENCOUNTER — Ambulatory Visit: Payer: Medicare HMO | Attending: Cardiology | Admitting: Cardiology

## 2023-04-21 ENCOUNTER — Encounter: Payer: Self-pay | Admitting: Cardiology

## 2023-04-21 VITALS — BP 120/62 | HR 61 | Ht 67.0 in | Wt 178.4 lb

## 2023-04-21 DIAGNOSIS — E782 Mixed hyperlipidemia: Secondary | ICD-10-CM

## 2023-04-21 NOTE — Progress Notes (Signed)
  Cardiology Office Note:  .   Date:  04/21/2023  ID:  Shawn Bentley, DOB 07-Jan-1946, MRN [id number] PCP: Shawn Daniel, PA-C  [facility] Providers Cardiologist:  Shawn Mainland, MD PCP: Shawn Daniel, PA-C  Chief Complaint  Patient presents with   Precordial pain   Follow-up      History of Present Illness: Marland Kitchen    Shawn Bentley is a 78 y.o. male with myelodysplastic syndrome, hereditary hemochromatosis, anemia, OSA  Patient is doing well.  He denies any chest pain or shortness of breath symptoms.  He is currently taking Crestor 10 mg daily, most recent lipid panel shows LDL of 32.   Vitals:   04/21/23 1406  BP: 120/62  Pulse: 61  SpO2: 98%     ROS:  Review of Systems  Cardiovascular:  Negative for chest pain, dyspnea on exertion, leg swelling, palpitations and syncope.     Studies Reviewed: Marland Kitchen        Independently interpreted 04/2023: Hb 10.4 Cr 1.07  02/2023: Chol 81, TG 61, HDL 35, LDL 32 Hb 10.1 Cr 1.0  Echocardiogram 04/16/2022: Normal LV systolic function with EF 68%. Left ventricle cavity is normal in size. Normal left ventricular wall thickness. Normal global wall motion. Normal diastolic filling pattern. Calculated EF 68%. Trileaflet aortic valve.  Trace aortic regurgitation. Structurally normal tricuspid valve with no regurgitation. No evidence of pulmonary hypertension. No significant change since 01/26/2019 and also from 2018.  Lexiscan Tetrofosmin stress test 04/22/2022: Lexiscan nuclear stress test performed using 1-day protocol. Normal myocardial perfusion. Stress LVEF 61%. Low risk study.     Physical Exam:   Physical Exam Vitals and nursing note reviewed.  Constitutional:      General: He is not in acute distress. Neck:     Vascular: No JVD.  Cardiovascular:     Rate and Rhythm: Normal rate and regular rhythm.     Heart sounds: Normal heart sounds. No murmur heard. Pulmonary:     Effort: Pulmonary effort is normal.      Breath sounds: Normal breath sounds. No wheezing or rales.      VISIT DIAGNOSES:   ICD-10-CM   1. Mixed hyperlipidemia  E78.2        ASSESSMENT AND PLAN: .    Shawn Bentley is a 78 y.o. male with  myelodysplastic syndrome, hereditary hemochromatosis, anemia, OSA  Workup in 2024 with no ischemia, structurally normal heart. LDL 32 on Crestor 10 mg daily. Okay to stop Crestor. Patient can check lipid panel in 6 months with PCP to make sure LDL is not significantly higher.  I will see him as needed.      F/u as needed  Signed, Shawn Negus, MD

## 2023-04-21 NOTE — Patient Instructions (Signed)
 Medication Instructions:   STOP TAKING ROSUVASTATIN (CRESTOR) NOW  *If you need a refill on your cardiac medications before your next appointment, please call your pharmacy*     Follow-Up:  AS NEEDED WITH DR. PATWARDHAN  Other Instructions    1st Floor: - Lobby - Registration  - Pharmacy  - Lab - Cafe  2nd Floor: - PV Lab - Diagnostic Testing (echo, CT, nuclear med)  3rd Floor: - Vacant  4th Floor: - TCTS (cardiothoracic surgery) - AFib Clinic - Structural Heart Clinic - Vascular Surgery  - Vascular Ultrasound  5th Floor: - [facility] Card[facility] (general and EP) - Clinical Pharmacy for coumadin, hypertension, lipid, weight-loss medications, and med management appointments    Valet parking services will be available as well.

## 2023-06-08 ENCOUNTER — Other Ambulatory Visit: Payer: Self-pay | Admitting: Cardiology
